# Patient Record
Sex: Female | Born: 1950 | Race: White | Hispanic: No
Health system: Southern US, Community
[De-identification: ages and names within clinical notes are randomized; demographics above are authoritative.]

## PROBLEM LIST (undated history)

## (undated) ENCOUNTER — Emergency Department (HOSPITAL_COMMUNITY): Admission: EM | Payer: Self-pay | Source: Home / Self Care

## (undated) DIAGNOSIS — J309 Allergic rhinitis, unspecified: Secondary | ICD-10-CM

## (undated) DIAGNOSIS — F329 Major depressive disorder, single episode, unspecified: Secondary | ICD-10-CM

## (undated) DIAGNOSIS — R002 Palpitations: Secondary | ICD-10-CM

## (undated) DIAGNOSIS — R079 Chest pain, unspecified: Secondary | ICD-10-CM

## (undated) DIAGNOSIS — E7800 Pure hypercholesterolemia, unspecified: Secondary | ICD-10-CM

## (undated) DIAGNOSIS — Z6830 Body mass index (BMI) 30.0-30.9, adult: Secondary | ICD-10-CM

## (undated) DIAGNOSIS — J45909 Unspecified asthma, uncomplicated: Secondary | ICD-10-CM

## (undated) DIAGNOSIS — F3342 Major depressive disorder, recurrent, in full remission: Secondary | ICD-10-CM

## (undated) DIAGNOSIS — J4530 Mild persistent asthma, uncomplicated: Secondary | ICD-10-CM

## (undated) DIAGNOSIS — E669 Obesity, unspecified: Secondary | ICD-10-CM

## (undated) DIAGNOSIS — N183 Chronic kidney disease, stage 3 unspecified: Secondary | ICD-10-CM

## (undated) DIAGNOSIS — K432 Incisional hernia without obstruction or gangrene: Secondary | ICD-10-CM

## (undated) DIAGNOSIS — E2839 Other primary ovarian failure: Secondary | ICD-10-CM

## (undated) DIAGNOSIS — I10 Essential (primary) hypertension: Secondary | ICD-10-CM

## (undated) DIAGNOSIS — E6609 Other obesity due to excess calories: Secondary | ICD-10-CM

## (undated) DIAGNOSIS — F32A Depression, unspecified: Secondary | ICD-10-CM

## (undated) DIAGNOSIS — K219 Gastro-esophageal reflux disease without esophagitis: Secondary | ICD-10-CM

## (undated) DIAGNOSIS — IMO0001 Reserved for inherently not codable concepts without codable children: Secondary | ICD-10-CM

## (undated) DIAGNOSIS — F324 Major depressive disorder, single episode, in partial remission: Secondary | ICD-10-CM

## (undated) HISTORY — DX: Body mass index (BMI) 30.0-30.9, adult: Z68.30

## (undated) HISTORY — DX: Pure hypercholesterolemia, unspecified: E78.00

## (undated) HISTORY — DX: Major depressive disorder, single episode, in partial remission: F32.4

## (undated) HISTORY — DX: Other obesity due to excess calories: E66.09

## (undated) HISTORY — DX: Palpitations: R00.2

## (undated) HISTORY — DX: Allergic rhinitis, unspecified: J30.9

## (undated) HISTORY — DX: Chronic kidney disease, stage 3 unspecified: N18.30

## (undated) HISTORY — DX: Essential (primary) hypertension: I10

## (undated) HISTORY — DX: Obesity, unspecified: E66.9

## (undated) HISTORY — DX: Chest pain, unspecified: R07.9

## (undated) HISTORY — DX: Mild persistent asthma, uncomplicated: J45.30

## (undated) HISTORY — DX: Major depressive disorder, recurrent, in full remission: F33.42

## (undated) HISTORY — DX: Incisional hernia without obstruction or gangrene: K43.2

## (undated) HISTORY — DX: Gastro-esophageal reflux disease without esophagitis: K21.9

## (undated) HISTORY — PX: WRIST SURGERY: SHX841

## (undated) HISTORY — DX: Other primary ovarian failure: E28.39

---

## 1998-02-16 ENCOUNTER — Inpatient Hospital Stay (HOSPITAL_COMMUNITY): Admission: RE | Admit: 1998-02-16 | Discharge: 1998-02-17 | Payer: Self-pay | Admitting: Neurosurgery

## 1999-10-28 ENCOUNTER — Encounter: Admission: RE | Admit: 1999-10-28 | Discharge: 1999-10-28 | Payer: Self-pay | Admitting: Family Medicine

## 1999-10-28 ENCOUNTER — Encounter: Payer: Self-pay | Admitting: Family Medicine

## 1999-10-29 ENCOUNTER — Inpatient Hospital Stay (HOSPITAL_COMMUNITY): Admission: EM | Admit: 1999-10-29 | Discharge: 1999-11-02 | Payer: Self-pay | Admitting: Internal Medicine

## 1999-10-30 ENCOUNTER — Encounter: Payer: Self-pay | Admitting: Internal Medicine

## 1999-11-01 ENCOUNTER — Encounter: Payer: Self-pay | Admitting: Infectious Diseases

## 1999-11-02 ENCOUNTER — Encounter: Payer: Self-pay | Admitting: Internal Medicine

## 1999-11-12 ENCOUNTER — Ambulatory Visit (HOSPITAL_COMMUNITY): Admission: RE | Admit: 1999-11-12 | Discharge: 1999-11-12 | Payer: Self-pay | Admitting: Family Medicine

## 1999-11-12 ENCOUNTER — Encounter: Payer: Self-pay | Admitting: Family Medicine

## 2002-02-27 ENCOUNTER — Encounter (INDEPENDENT_AMBULATORY_CARE_PROVIDER_SITE_OTHER): Payer: Self-pay | Admitting: Specialist

## 2002-02-27 ENCOUNTER — Ambulatory Visit (HOSPITAL_BASED_OUTPATIENT_CLINIC_OR_DEPARTMENT_OTHER): Admission: RE | Admit: 2002-02-27 | Discharge: 2002-02-27 | Payer: Self-pay | Admitting: *Deleted

## 2006-05-26 ENCOUNTER — Ambulatory Visit (HOSPITAL_COMMUNITY): Admission: RE | Admit: 2006-05-26 | Discharge: 2006-05-26 | Payer: Self-pay | Admitting: General Surgery

## 2006-05-26 ENCOUNTER — Encounter (INDEPENDENT_AMBULATORY_CARE_PROVIDER_SITE_OTHER): Payer: Self-pay | Admitting: Specialist

## 2008-03-21 ENCOUNTER — Encounter: Admission: RE | Admit: 2008-03-21 | Discharge: 2008-03-21 | Payer: Self-pay | Admitting: Gastroenterology

## 2010-05-04 ENCOUNTER — Encounter: Admission: RE | Admit: 2010-05-04 | Discharge: 2010-05-04 | Payer: Self-pay | Admitting: Rheumatology

## 2011-02-25 NOTE — Op Note (Signed)
[name]  Patient:    [name], [name] Visit Number: [id number] MRN: [id number]          Service Type: DSU Location: [facility] Attending Physician:  Kandis Mannan Dictated by:   Donnie Coffin Samuella Cota, M.D. Proc. Date: 02/27/02 Admit Date:  02/27/2002   CC:         Katherine Roan, M.D.   Operative Report  CCS# [id number]  PREOPERATIVE DIAGNOSIS:  Large cyst, right breast with intramural lesion.  POSTOPERATIVE DIAGNOSIS:  Large cyst, right breast with intramural lesion.  OPERATION PERFORMED:  Excision of cystic mass, right breast with intramural lesion.  SURGEON:  Maisie Fus B. Samuella Cota, M.D.  ANESTHESIA:  1% Xylocaine local with anesthesia monitoring, anesthesiologist and CRNA Elliot Dally.  DESCRIPTION OF PROCEDURE:  The patient was taken to the operating room and placed on the table in supine position.  The right breast was prepped and draped in sterile field.  The patient had an obvious palpable cystic mass at the 9 oclock position of the right breast.  A transverse radial incision was outlined with the skin marker.  1% Xylocaine local was then used to infiltrate the skin.  The incision was made through skin and subcutaneous tissues.  The cyst was readily seen and with very gentle dissection, the cyst was dissected completely without rupturing the cyst.  Some breast tissue which was attached posteriorly and laterally was removed with the specimen.  A suture was placed through this breast tissue to mark the posterior lateral margin.  The cystic mass measured 5 x 5 x 8 cm.  The specimen was sent intact to the pathologist for further evaluation of the intramural lesion.  Bleeding was controlled with the cautery.  Subcutaneous tissues were closed with a running subcuticular suture of 4-0 Vicryl.  Benzoin and half inch Steri-Strips were used to reinforced the skin closure.  Dry sterile dressing using 4 x 4s, ABD, and 4 inch HypaFix was applied.  The  patient seemed to tolerate the procedure well and was taken to the PACU in satisfactory condition. Dictated by:   Donnie Coffin Samuella Cota, M.D. Attending Physician:  Kandis Mannan DD:  02/27/02 TD:  02/28/02 Job: [id number]

## 2011-02-25 NOTE — Op Note (Signed)
[name], [name]               ACCOUNT NO.:  [id number]   MEDICAL RECORD NO.:  [id number]          PATIENT TYPE:  AMB   LOCATION:  DAY                          FACILITY:  [facility]   PHYSICIAN:  Angelia Mould. Derrell Lolling, M.D.DATE OF BIRTH:  [birth year]   DATE OF PROCEDURE:  05/26/2006  DATE OF DISCHARGE:                                 OPERATIVE REPORT   PREOPERATIVE DIAGNOSIS:  Chronic cholecystitis with cholelithiasis.   POSTOPERATIVE DIAGNOSIS:  Chronic cholecystitis with cholelithiasis.   OPERATION PERFORMED:  Laparoscopic cholecystectomy with intraoperative  cholangiogram.   SURGEON:  Angelia Mould. Derrell Lolling, M.D.   FIRST ASSISTANT:  Lebron Conners, M.D.   OPERATIVE INDICATIONS:  This is a 60 year old white female who has been  having episodes of biliary colic for about 10 months.  She had ultrasound in  November 2006 which showed gallstones.  She postponed her surgery until this  time.  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  She is brought to the operating room electively.   OPERATIVE FINDINGS:  The patient had loss of soft adhesions in the upper  abdomen.  These were especially dense in the left upper quadrant, but there  were lot of adhesions of the dome of the right lobe of the liver to the  diaphragm as well.  The gallbladder was thin-walled and soft.  The anatomy  of the cystic duct, cystic artery, and common bile duct were conventional.  The cholangiogram was normal, showing normal intrahepatic and extrahepatic  bile duct anatomy, no filling defects, and no obstruction with good flow of  contrast into the duodenum.  The large intestine, small intestine,  peritoneal surfaces, duodenum, and stomach looked normal.   OPERATIVE TECHNIQUE:  Following the induction of general endotracheal  anesthesia, the patient's abdomen was prepped and draped in a  sterile  fashion.  Then 0.5% Marcaine with epinephrine was used as a local  infiltration anesthetic.  A curved transverse incision was made at the lower  rim of the umbilicus, through a previous laparoscopy scar.  The fascia was  incised in the midline and the abdominal cavity entered under direct vision.  A 10-mm Hassan trocar was inserted and secured with a pursestring suture of  #0 Vicryl.  Pneumoperitoneum was created.  Video camera was inserted with  visualization findings as described above.   We placed two 5-mm trocars in the right midabdomen; and with direct  visualization took down a lot of adhesions that were to the right of the  falciform ligament.  I was then able to see clearly to place a 10-mm trocar  in the subxiphoid region.  The patient was positioned.  I took down all of  the adhesions between the undersurface of the diaphragm and the dome of the  liver.  The fundus of the gallbladder were elevated.  There were a lot of  adhesions to the gallbladder.  These were all carefully taken down.  We  probably spent 10  or 15 minutes taking all the adhesions down completely.   The infundibulum of the gallbladder was small; and the cystic duct and the  cystic artery were small.  We dissected out the cystic duct and the cystic  artery.  The cystic artery was secured with metal clips and divided as we  went around the wall of the gallbladder.  This created a large window behind  the cystic duct.  A cholangiogram catheter was inserted into the cystic  duct, and a cholangiogram was obtained using a C-arm.  The cholangiogram was  normal as described above.  The cholangiogram catheter was removed, the  cystic duct secured with multiple metal clips and divided; and the  gallbladder was then dissected from its bed with electrocautery and removed  through the umbilical port.   The operative field was inspected and irrigated.  At the end of the case the  irrigation fluid was completely  clear; and there was no bleeding and no bile  leak whatsoever.  The trocars were removed under direct vision.  There was  no bleeding from the trocar sites.  Pneumoperitoneum was released.  The  fascia at the umbilicus was closed with #0 Vicryl sutures.  Skin incisions  were irrigated with saline; and closed with subcuticular sutures of 4-0  Monocryl and Steri-Strips.  Clean bandages were placed; and the patient  taken to the recovery room in stable condition.  Estimated blood loss about  5-10 mL.  Complications none.  Sponge and instrument counts were correct.      Angelia Mould. Derrell Lolling, M.D.  Electronically Signed     HMI/MEDQ  D:  05/26/2006  T:  05/26/2006  Job:  [id number]   cc:   [name] L. [name], M.D.  Fax: [phone]

## 2014-03-06 ENCOUNTER — Emergency Department (HOSPITAL_BASED_OUTPATIENT_CLINIC_OR_DEPARTMENT_OTHER): Payer: BC Managed Care – PPO

## 2014-03-06 ENCOUNTER — Encounter (HOSPITAL_BASED_OUTPATIENT_CLINIC_OR_DEPARTMENT_OTHER): Payer: Self-pay | Admitting: Emergency Medicine

## 2014-03-06 ENCOUNTER — Emergency Department (HOSPITAL_BASED_OUTPATIENT_CLINIC_OR_DEPARTMENT_OTHER)
Admission: EM | Admit: 2014-03-06 | Discharge: 2014-03-06 | Disposition: A | Discharge status: Home / Self Care | Payer: BC Managed Care – PPO | Attending: Emergency Medicine | Admitting: Emergency Medicine

## 2014-03-06 DIAGNOSIS — F3289 Other specified depressive episodes: Secondary | ICD-10-CM | POA: Insufficient documentation

## 2014-03-06 DIAGNOSIS — Y9301 Activity, walking, marching and hiking: Secondary | ICD-10-CM | POA: Insufficient documentation

## 2014-03-06 DIAGNOSIS — M549 Dorsalgia, unspecified: Secondary | ICD-10-CM

## 2014-03-06 DIAGNOSIS — S0180XA Unspecified open wound of other part of head, initial encounter: Secondary | ICD-10-CM | POA: Insufficient documentation

## 2014-03-06 DIAGNOSIS — W1809XA Striking against other object with subsequent fall, initial encounter: Secondary | ICD-10-CM | POA: Insufficient documentation

## 2014-03-06 DIAGNOSIS — K219 Gastro-esophageal reflux disease without esophagitis: Secondary | ICD-10-CM | POA: Insufficient documentation

## 2014-03-06 DIAGNOSIS — J45909 Unspecified asthma, uncomplicated: Secondary | ICD-10-CM | POA: Insufficient documentation

## 2014-03-06 DIAGNOSIS — S0181XA Laceration without foreign body of other part of head, initial encounter: Secondary | ICD-10-CM

## 2014-03-06 DIAGNOSIS — F329 Major depressive disorder, single episode, unspecified: Secondary | ICD-10-CM | POA: Insufficient documentation

## 2014-03-06 DIAGNOSIS — S60229A Contusion of unspecified hand, initial encounter: Secondary | ICD-10-CM

## 2014-03-06 DIAGNOSIS — Y92009 Unspecified place in unspecified non-institutional (private) residence as the place of occurrence of the external cause: Secondary | ICD-10-CM | POA: Insufficient documentation

## 2014-03-06 DIAGNOSIS — W19XXXA Unspecified fall, initial encounter: Secondary | ICD-10-CM

## 2014-03-06 DIAGNOSIS — W010XXA Fall on same level from slipping, tripping and stumbling without subsequent striking against object, initial encounter: Secondary | ICD-10-CM | POA: Insufficient documentation

## 2014-03-06 DIAGNOSIS — IMO0002 Reserved for concepts with insufficient information to code with codable children: Secondary | ICD-10-CM | POA: Insufficient documentation

## 2014-03-06 HISTORY — DX: Unspecified asthma, uncomplicated: J45.909

## 2014-03-06 HISTORY — DX: Depression, unspecified: F32.A

## 2014-03-06 HISTORY — DX: Major depressive disorder, single episode, unspecified: F32.9

## 2014-03-06 HISTORY — DX: Reserved for inherently not codable concepts without codable children: IMO0001

## 2014-03-06 HISTORY — DX: Gastro-esophageal reflux disease without esophagitis: K21.9

## 2014-03-06 MED ORDER — HYDROCODONE-ACETAMINOPHEN 5-325 MG PO TABS: 1.0000 | ORAL_TABLET | Freq: Four times a day (QID) | ORAL | Status: DC | PRN

## 2014-03-06 NOTE — Discharge Instructions (Signed)
Contusion A contusion is a deep bruise. Contusions are the result of an injury that caused bleeding under the skin. The contusion may turn blue, purple, or yellow. Minor injuries will give you a painless contusion, but more severe contusions may stay painful and swollen for a few weeks.  CAUSES  A contusion is usually caused by a blow, trauma, or direct force to an area of the body. SYMPTOMS   Swelling and redness of the injured area.  Bruising of the injured area.  Tenderness and soreness of the injured area.  Pain. DIAGNOSIS  The diagnosis can be made by taking a history and physical exam. An X-ray, CT scan, or MRI may be needed to determine if there were any associated injuries, such as fractures. TREATMENT  Specific treatment will depend on what area of the body was injured. In general, the best treatment for a contusion is resting, icing, elevating, and applying cold compresses to the injured area. Over-the-counter medicines may also be recommended for pain control. Ask your caregiver what the best treatment is for your contusion. HOME CARE INSTRUCTIONS   Put ice on the injured area.  Put ice in a plastic bag.  Place a towel between your skin and the bag.  Leave the ice on for 15-20 minutes, 03-04 times a day.  Only take over-the-counter or prescription medicines for pain, discomfort, or fever as directed by your caregiver. Your caregiver may recommend avoiding anti-inflammatory medicines (aspirin, ibuprofen, and naproxen) for 48 hours because these medicines may increase bruising.  Rest the injured area.  If possible, elevate the injured area to reduce swelling. SEEK IMMEDIATE MEDICAL CARE IF:   You have increased bruising or swelling.  You have pain that is getting worse.  Your swelling or pain is not relieved with medicines. MAKE SURE YOU:   Understand these instructions.  Will watch your condition.  Will get help right away if you are not doing well or get  worse. Document Released: 07/06/2005 Document Revised: 12/19/2011 Document Reviewed: 08/01/2011 [facility] Patient Information 2014 [place], [place].  Facial Laceration  A facial laceration is a cut on the face. These injuries can be painful and cause bleeding. Lacerations usually heal quickly, but they need special care to reduce scarring. DIAGNOSIS  Your health care provider will take a medical history, ask for details about how the injury occurred, and examine the wound to determine how deep the cut is. TREATMENT  Some facial lacerations may not require closure. Others may not be able to be closed because of an increased risk of infection. The risk of infection and the chance for successful closure will depend on various factors, including the amount of time since the injury occurred. The wound may be cleaned to help prevent infection. If closure is appropriate, pain medicines may be given if needed. Your health care provider will use stitches (sutures), wound glue (adhesive), or skin adhesive strips to repair the laceration. These tools bring the skin edges together to allow for faster healing and a better cosmetic outcome. If needed, you may also be given a tetanus shot. HOME CARE INSTRUCTIONS  Only take over-the-counter or prescription medicines as directed by your health care provider.  Follow your health care provider's instructions for wound care. These instructions will vary depending on the technique used for closing the wound. For Sutures:  Keep the wound clean and dry.   If you were given a bandage (dressing), you should change it at least once a day. Also change the dressing if  it becomes wet or dirty, or as directed by your health care provider.   Wash the wound with soap and water 2 times a day. Rinse the wound off with water to remove all soap. Pat the wound dry with a clean towel.   After cleaning, apply a thin layer of the antibiotic ointment recommended by your health care  provider. This will help prevent infection and keep the dressing from sticking.   You may shower as usual after the first 24 hours. Do not soak the wound in water until the sutures are removed.   Get your sutures removed as directed by your health care provider. With facial lacerations, sutures should usually be taken out after 4 5 days to avoid stitch marks.   Wait a few days after your sutures are removed before applying any makeup. For Skin Adhesive Strips:  Keep the wound clean and dry.   Do not get the skin adhesive strips wet. You may bathe carefully, using caution to keep the wound dry.   If the wound gets wet, pat it dry with a clean towel.   Skin adhesive strips will fall off on their own. You may trim the strips as the wound heals. Do not remove skin adhesive strips that are still stuck to the wound. They will fall off in time.  For Wound Adhesive:  You may briefly wet your wound in the shower or bath. Do not soak or scrub the wound. Do not swim. Avoid periods of heavy sweating until the skin adhesive has fallen off on its own. After showering or bathing, gently pat the wound dry with a clean towel.   Do not apply liquid medicine, cream medicine, ointment medicine, or makeup to your wound while the skin adhesive is in place. This may loosen the film before your wound is healed.   If a dressing is placed over the wound, be careful not to apply tape directly over the skin adhesive. This may cause the adhesive to be pulled off before the wound is healed.   Avoid prolonged exposure to sunlight or tanning lamps while the skin adhesive is in place.  The skin adhesive will usually remain in place for 5 10 days, then naturally fall off the skin. Do not pick at the adhesive film.  After Healing: Once the wound has healed, cover the wound with sunscreen during the day for 1 full year. This can help minimize scarring. Exposure to ultraviolet light in the first year will darken  the scar. It can take 1 2 years for the scar to lose its redness and to heal completely.  SEEK IMMEDIATE MEDICAL CARE IF:  You have redness, pain, or swelling around the wound.   You see ayellowish-white fluid (pus) coming from the wound.   You have chills or a fever.  MAKE SURE YOU:  Understand these instructions.  Will watch your condition.  Will get help right away if you are not doing well or get worse. Document Released: 11/03/2004 Document Revised: 07/17/2013 Document Reviewed: 05/09/2013 [facility] Patient Information 2014 [place], [place].  Laceration Care, Adult A laceration is a cut that goes through all layers of the skin. The cut goes into the tissue beneath the skin. HOME CARE For stitches (sutures) or staples:  Keep the cut clean and dry.  If you have a bandage (dressing), change it at least once a day. Change the bandage if it gets wet or dirty, or as told by your doctor.  Wash the cut with  soap and water 2 times a day. Rinse the cut with water. Pat it dry with a clean towel.  Put a thin layer of medicated cream on the cut as told by your doctor.  You may shower after the first 24 hours. Do not soak the cut in water until the stitches are removed.  Only take medicines as told by your doctor.  Have your stitches or staples removed as told by your doctor. For skin adhesive strips:  Keep the cut clean and dry.  Do not get the strips wet. You may take a bath, but be careful to keep the cut dry.  If the cut gets wet, pat it dry with a clean towel.  The strips will fall off on their own. Do not remove the strips that are still stuck to the cut. For wound glue:  You may shower or take baths. Do not soak or scrub the cut. Do not swim. Avoid heavy sweating until the glue falls off on its own. After a shower or bath, pat the cut dry with a clean towel.  Do not put medicine on your cut until the glue falls off.  If you have a bandage, do not put tape over the  glue.  Avoid lots of sunlight or tanning lamps until the glue falls off. Put sunscreen on the cut for the first year to reduce your scar.  The glue will fall off on its own. Do not pick at the glue. You may need a tetanus shot if:  You cannot remember when you had your last tetanus shot.  You have never had a tetanus shot. If you need a tetanus shot and you choose not to have one, you may get tetanus. Sickness from tetanus can be serious. GET HELP RIGHT AWAY IF:   Your pain does not get better with medicine.  Your arm, hand, leg, or foot loses feeling (numbness) or changes color.  Your cut is bleeding.  Your joint feels weak, or you cannot use your joint.  You have painful lumps on your body.  Your cut is red, puffy (swollen), or painful.  You have a red line on the skin near the cut.  You have yellowish-white fluid (pus) coming from the cut.  You have a fever.  You have a bad smell coming from the cut or bandage.  Your cut breaks open before or after stitches are removed.  You notice something coming out of the cut, such as wood or glass.  You cannot move a finger or toe. MAKE SURE YOU:   Understand these instructions.  Will watch your condition.  Will get help right away if you are not doing well or get worse. Document Released: 03/14/2008 Document Revised: 12/19/2011 Document Reviewed: 03/22/2011 [facility] Patient Information 2014 [place].

## 2014-03-06 NOTE — ED Notes (Addendum)
Patient states she missed a step on her deck and fell.  Landed on her face.  Multiple areas of scrapes and lacerations.  Was seen at [facility], and sent here for further evaluation. Denies loc.

## 2014-03-06 NOTE — ED Notes (Signed)
Patient transported to X-ray 

## 2014-03-06 NOTE — ED Provider Notes (Signed)
CSN: [id number]     Arrival date & time 03/06/14  1157 History   First MD Initiated Contact with Patient 03/06/14 1217     Chief Complaint  Patient presents with  . Facial Laceration    fall     (Consider location/radiation/quality/duration/timing/severity/associated sxs/prior Treatment) HPI Comments: Pt states that she was walking up the stairs of her deck and she tripped and landed hit her face. No loc or dizziness associated with the fall. Pt states that she went to the [facility] and was sent here for further evaluation. Pt c/o lower back pain and right hand pain. Denies blurred vision, numbness or weakness. Tetanus is utd. Pt states that she has multiple cuts to her face. Pt states that she is not having any problems with opening and closing mouth  The history is provided by the patient. No language interpreter was used.    Past Medical History  Diagnosis Date  . Asthma   . Depression   . Reflux    Past Surgical History  Procedure Laterality Date  . Wrist surgery Left    No family history on file. History  Substance Use Topics  . Smoking status: Not on file  . Smokeless tobacco: Not on file  . Alcohol Use: Not on file   OB History   Grav Para Term Preterm Abortions TAB SAB Ect Mult Living                 Review of Systems  Constitutional: Negative.   Cardiovascular: Negative.       Allergies  Tramadol  Home Medications   Prior to Admission medications   Medication Sig Start Date End Date Taking? Authorizing Provider  beclomethasone (QVAR) 40 MCG/ACT inhaler Inhale into the lungs 2 (two) times daily.   Yes Historical Provider, MD  desvenlafaxine (PRISTIQ) 100 MG 24 hr tablet Take 100 mg by mouth daily.   Yes Historical Provider, MD  MULTIPLE VITAMIN PO Take by mouth.   Yes Historical Provider, MD  ranitidine (ZANTAC) 150 MG tablet Take 150 mg by mouth 2 (two) times daily.   Yes Historical Provider, MD   BP 143/85  Pulse 70  Temp(Src) 98.1 F (36.7 C)  (Oral)  Ht 5\' 5"  (1.651 m)  Wt 182 lb (82.555 kg)  BMI 30.29 kg/m2  SpO2 100% Physical Exam  Nursing note and vitals reviewed. Constitutional: She is oriented to person, place, and time. She appears well-developed and well-nourished.  HENT:  Right Ear: External ear normal.  Left Ear: External ear normal.  Opening and closing mouth without any problem.no dental injury noted. Abrasions and laceration to the face  Eyes: Conjunctivae and EOM are normal. Pupils are equal, round, and reactive to light.  Neck: Neck supple.  Cardiovascular: Normal rate and regular rhythm.   Pulmonary/Chest: Effort normal and breath sounds normal.  Abdominal: Soft. Bowel sounds are normal. There is no tenderness.  Musculoskeletal:       Cervical back: Normal.       Thoracic back: Normal.       Lumbar back: She exhibits bony tenderness. She exhibits normal range of motion.  Neurological: She is alert and oriented to person, place, and time.  Skin:  Bruising noted to the right hand. Generalized tenderness. Full rom: laceration to between the eyebrows, irregular one to the chin and one below the nose    ED Course  LACERATION REPAIR Date/Time: 03/06/2014 1:28 PM Performed by: Glendell Docker Authorized by: Glendell Docker Consent: Verbal consent obtained.  Risks and benefits: risks, benefits and alternatives were discussed Patient identity confirmed: verbally with patient Time out: Immediately prior to procedure a "time out" was called to verify the correct patient, procedure, equipment, support staff and site/side marked as required. Body area: head/neck Location details: chin Laceration length: 1.5 cm Contamination: The wound is contaminated. Foreign bodies: unknown Anesthesia: local infiltration Local anesthetic: lidocaine 1% with epinephrine Irrigation solution: saline Irrigation method: syringe Amount of cleaning: extensive Skin closure: 6-0 Prolene Number of sutures: 4 Technique:  simple Approximation: close Approximation difficulty: simple Dressing: 4x4 sterile gauze Patient tolerance: Patient tolerated the procedure well with no immediate complications.  LACERATION REPAIR Date/Time: 03/06/2014 1:29 PM Performed by: Glendell Docker Authorized by: Glendell Docker Consent: Verbal consent obtained. Risks and benefits: risks, benefits and alternatives were discussed Consent given by: patient Patient identity confirmed: verbally with patient Time out: Immediately prior to procedure a "time out" was called to verify the correct patient, procedure, equipment, support staff and site/side marked as required. Body area: head/neck Location details: forehead Laceration length: 2 cm Foreign bodies: no foreign bodies Irrigation method: syringe Skin closure: glue Approximation: close Approximation difficulty: simple Patient tolerance: Patient tolerated the procedure well with no immediate complications.  LACERATION REPAIR Date/Time: 03/06/2014 1:30 PM Performed by: Glendell Docker Authorized by: Glendell Docker Consent: Verbal consent obtained. Risks and benefits: risks, benefits and alternatives were discussed Consent given by: patient Patient identity confirmed: verbally with patient Body area: head/neck (below the nose) Foreign bodies: unknown Irrigation solution: tap water Irrigation method: syringe Amount of cleaning: extensive Skin closure: glue Approximation: close Approximation difficulty: simple Patient tolerance: Patient tolerated the procedure well with no immediate complications.   (including critical care time) Labs Review Labs Reviewed - No data to display  Imaging Review No results found.   EKG Interpretation None      MDM   Final diagnoses:  Facial laceration  Fall  Hand contusion  Back pain    Wounds closed:no bony abnormality noted. Pt is okay to follow up for suture removal. Pt is not on thinners, dont think any  imaging is needed for head and neck at this time    Glendell Docker, NP 03/06/14 1333

## 2014-03-06 NOTE — ED Provider Notes (Signed)
Medical screening examination/treatment/procedure(s) were conducted as a shared visit with non-physician practitioner(s) or resident  and myself.  I personally evaluated the patient during the encounter and agree with the findings and plan unless otherwise indicated.    I have personally reviewed any xrays and/ or EKG's with the provider and I agree with interpretation.   Mechanical fall landing on face with lacerations.  Denies HA, vomiting or blood thinners. Pt feels well other than lacerations.  On exam multiple small lacerations repaired by PA, well approximated, CNs intact, no neck pain, full rom head and neck, well appearing otherwise.  Fup discussed.   Facial lacerations, acute head injury  Mariea Clonts, MD 03/06/14 1546

## 2014-05-13 DIAGNOSIS — Z0000 Encounter for general adult medical examination without abnormal findings: Secondary | ICD-10-CM | POA: Diagnosis not present

## 2014-05-13 DIAGNOSIS — N951 Menopausal and female climacteric states: Secondary | ICD-10-CM | POA: Diagnosis not present

## 2014-05-13 DIAGNOSIS — Z136 Encounter for screening for cardiovascular disorders: Secondary | ICD-10-CM | POA: Diagnosis not present

## 2014-05-13 DIAGNOSIS — Z131 Encounter for screening for diabetes mellitus: Secondary | ICD-10-CM | POA: Diagnosis not present

## 2014-05-26 DIAGNOSIS — M899 Disorder of bone, unspecified: Secondary | ICD-10-CM | POA: Diagnosis not present

## 2014-10-24 DIAGNOSIS — J019 Acute sinusitis, unspecified: Secondary | ICD-10-CM | POA: Diagnosis not present

## 2014-10-29 DIAGNOSIS — H26493 Other secondary cataract, bilateral: Secondary | ICD-10-CM | POA: Diagnosis not present

## 2014-10-29 DIAGNOSIS — H538 Other visual disturbances: Secondary | ICD-10-CM | POA: Diagnosis not present

## 2014-10-29 DIAGNOSIS — Z961 Presence of intraocular lens: Secondary | ICD-10-CM | POA: Diagnosis not present

## 2014-11-07 DIAGNOSIS — H18413 Arcus senilis, bilateral: Secondary | ICD-10-CM | POA: Diagnosis not present

## 2014-11-07 DIAGNOSIS — Z961 Presence of intraocular lens: Secondary | ICD-10-CM | POA: Diagnosis not present

## 2014-11-07 DIAGNOSIS — H02839 Dermatochalasis of unspecified eye, unspecified eyelid: Secondary | ICD-10-CM | POA: Diagnosis not present

## 2014-11-07 DIAGNOSIS — H35372 Puckering of macula, left eye: Secondary | ICD-10-CM | POA: Diagnosis not present

## 2014-11-07 DIAGNOSIS — H26492 Other secondary cataract, left eye: Secondary | ICD-10-CM | POA: Diagnosis not present

## 2014-11-19 DIAGNOSIS — F3342 Major depressive disorder, recurrent, in full remission: Secondary | ICD-10-CM | POA: Diagnosis not present

## 2014-11-19 DIAGNOSIS — K432 Incisional hernia without obstruction or gangrene: Secondary | ICD-10-CM | POA: Diagnosis not present

## 2014-12-30 DIAGNOSIS — H40013 Open angle with borderline findings, low risk, bilateral: Secondary | ICD-10-CM | POA: Diagnosis not present

## 2015-05-19 DIAGNOSIS — F3342 Major depressive disorder, recurrent, in full remission: Secondary | ICD-10-CM | POA: Diagnosis not present

## 2015-05-19 DIAGNOSIS — K432 Incisional hernia without obstruction or gangrene: Secondary | ICD-10-CM | POA: Diagnosis not present

## 2015-05-19 DIAGNOSIS — R6889 Other general symptoms and signs: Secondary | ICD-10-CM | POA: Diagnosis not present

## 2015-06-18 DIAGNOSIS — Z131 Encounter for screening for diabetes mellitus: Secondary | ICD-10-CM | POA: Diagnosis not present

## 2015-06-18 DIAGNOSIS — Z0000 Encounter for general adult medical examination without abnormal findings: Secondary | ICD-10-CM | POA: Diagnosis not present

## 2015-06-18 DIAGNOSIS — Z23 Encounter for immunization: Secondary | ICD-10-CM | POA: Diagnosis not present

## 2015-06-24 DIAGNOSIS — Z0000 Encounter for general adult medical examination without abnormal findings: Secondary | ICD-10-CM | POA: Diagnosis not present

## 2015-06-24 DIAGNOSIS — Z23 Encounter for immunization: Secondary | ICD-10-CM | POA: Diagnosis not present

## 2015-06-24 DIAGNOSIS — Z131 Encounter for screening for diabetes mellitus: Secondary | ICD-10-CM | POA: Diagnosis not present

## 2015-07-02 DIAGNOSIS — H40013 Open angle with borderline findings, low risk, bilateral: Secondary | ICD-10-CM | POA: Diagnosis not present

## 2015-07-02 DIAGNOSIS — H3531 Nonexudative age-related macular degeneration: Secondary | ICD-10-CM | POA: Diagnosis not present

## 2015-07-21 DIAGNOSIS — Z961 Presence of intraocular lens: Secondary | ICD-10-CM | POA: Diagnosis not present

## 2015-07-21 DIAGNOSIS — H18413 Arcus senilis, bilateral: Secondary | ICD-10-CM | POA: Diagnosis not present

## 2015-07-21 DIAGNOSIS — H02839 Dermatochalasis of unspecified eye, unspecified eyelid: Secondary | ICD-10-CM | POA: Diagnosis not present

## 2015-07-21 DIAGNOSIS — H26491 Other secondary cataract, right eye: Secondary | ICD-10-CM | POA: Diagnosis not present

## 2015-12-28 DIAGNOSIS — H40013 Open angle with borderline findings, low risk, bilateral: Secondary | ICD-10-CM | POA: Diagnosis not present

## 2015-12-28 DIAGNOSIS — H16223 Keratoconjunctivitis sicca, not specified as Sjogren's, bilateral: Secondary | ICD-10-CM | POA: Diagnosis not present

## 2015-12-28 DIAGNOSIS — Z961 Presence of intraocular lens: Secondary | ICD-10-CM | POA: Diagnosis not present

## 2016-01-28 IMAGING — CR DG LUMBAR SPINE COMPLETE 4+V
5 series · 5 of 5 positions shown · non-contrast
Comparison: None.

CLINICAL DATA: Low back pain following injury

EXAM:
LUMBAR SPINE - COMPLETE 4+ VIEW

[t l-spine a.p.]
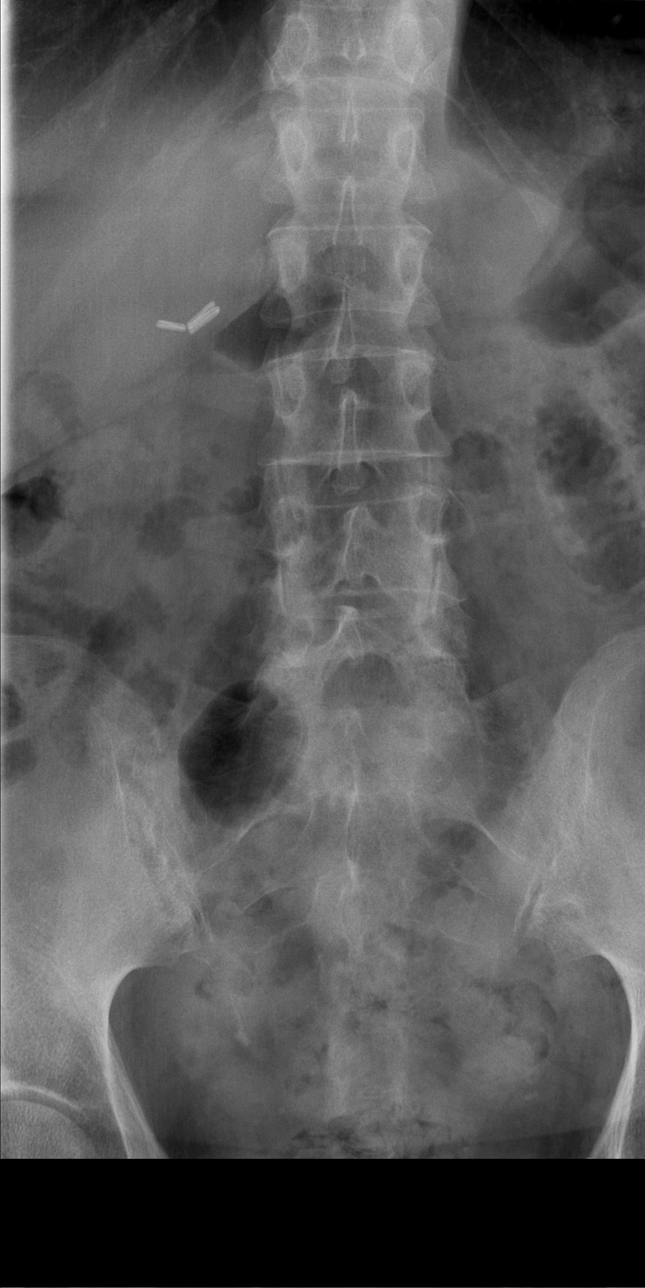

[t l-spine oblique exposure (1 of 2)]
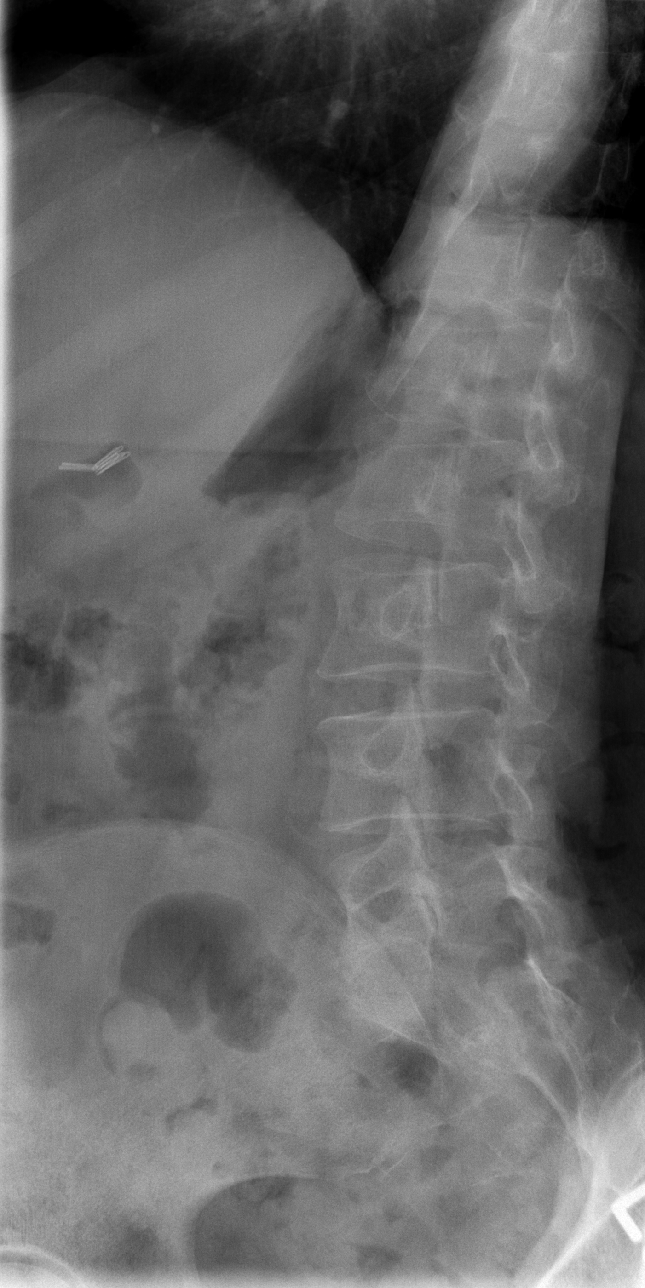

[t l-spine oblique exposure (2 of 2)]
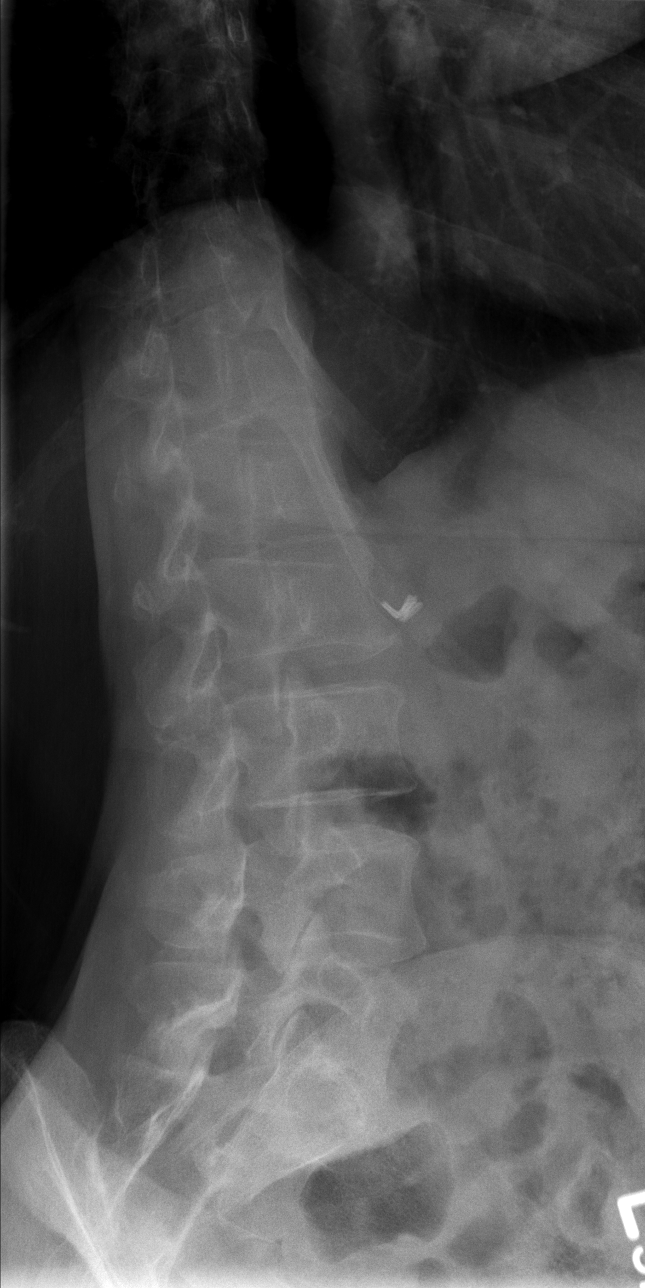

[t l-spine lat]
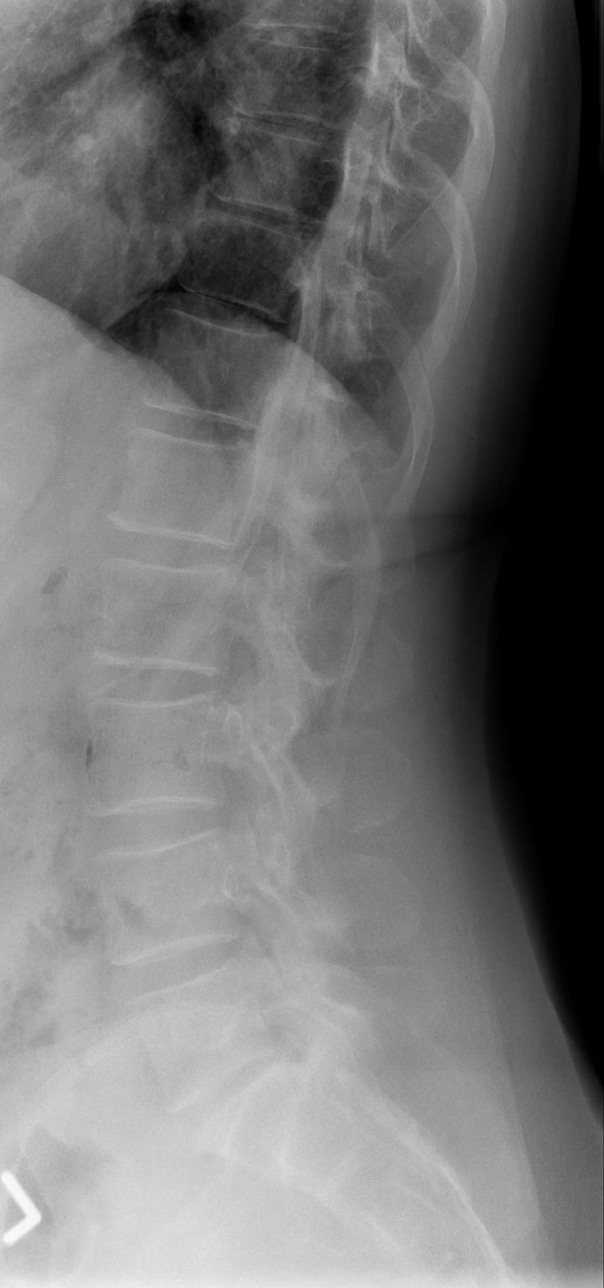

[t l-spine l5-s1 spot]
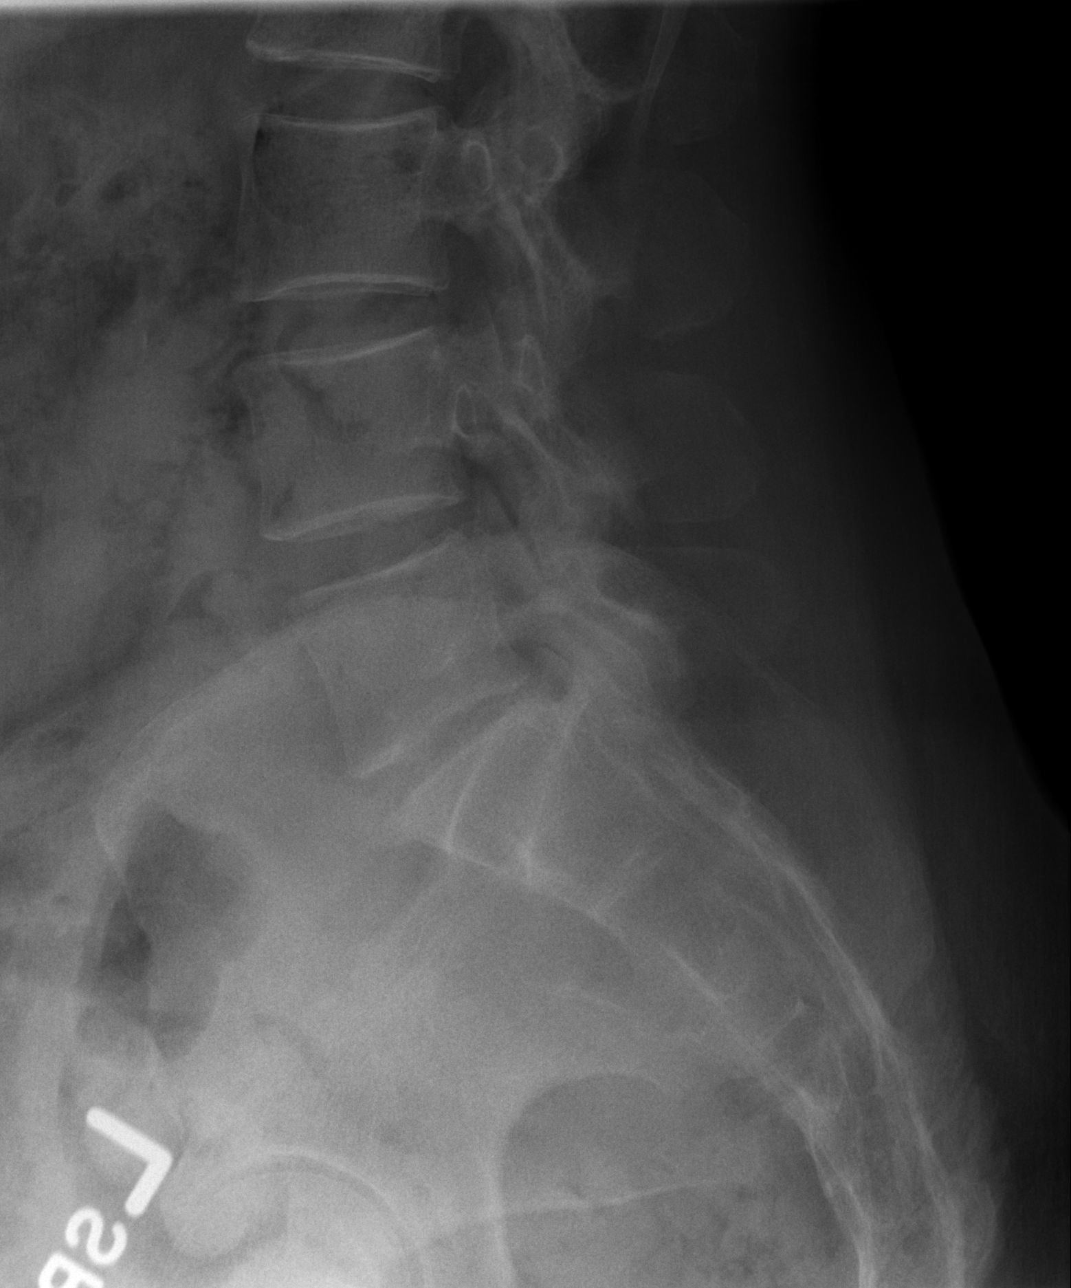

[5 of 5 positions shown; findings below may reference images not displayed]

FINDINGS: Five lumbar type vertebral bodies are well visualized. No pars
defects are seen. Mild disc space narrowing is noted at L5-S1. No
acute bony abnormality is seen.
IMPRESSION: Mild degenerative change without acute abnormality.

## 2016-07-22 DIAGNOSIS — J040 Acute laryngitis: Secondary | ICD-10-CM | POA: Diagnosis not present

## 2016-07-22 DIAGNOSIS — J029 Acute pharyngitis, unspecified: Secondary | ICD-10-CM | POA: Diagnosis not present

## 2016-07-30 DIAGNOSIS — H6692 Otitis media, unspecified, left ear: Secondary | ICD-10-CM | POA: Diagnosis not present

## 2016-07-30 DIAGNOSIS — R05 Cough: Secondary | ICD-10-CM | POA: Diagnosis not present

## 2016-08-14 DIAGNOSIS — J309 Allergic rhinitis, unspecified: Secondary | ICD-10-CM | POA: Diagnosis not present

## 2016-09-13 DIAGNOSIS — Z1211 Encounter for screening for malignant neoplasm of colon: Secondary | ICD-10-CM | POA: Diagnosis not present

## 2016-09-13 DIAGNOSIS — E7800 Pure hypercholesterolemia, unspecified: Secondary | ICD-10-CM | POA: Diagnosis not present

## 2016-09-13 DIAGNOSIS — I10 Essential (primary) hypertension: Secondary | ICD-10-CM | POA: Diagnosis not present

## 2016-09-13 DIAGNOSIS — R030 Elevated blood-pressure reading, without diagnosis of hypertension: Secondary | ICD-10-CM | POA: Diagnosis not present

## 2016-09-13 DIAGNOSIS — K219 Gastro-esophageal reflux disease without esophagitis: Secondary | ICD-10-CM | POA: Diagnosis not present

## 2016-09-13 DIAGNOSIS — Z0000 Encounter for general adult medical examination without abnormal findings: Secondary | ICD-10-CM | POA: Diagnosis not present

## 2016-09-13 DIAGNOSIS — J4530 Mild persistent asthma, uncomplicated: Secondary | ICD-10-CM | POA: Diagnosis not present

## 2016-09-13 DIAGNOSIS — Z131 Encounter for screening for diabetes mellitus: Secondary | ICD-10-CM | POA: Diagnosis not present

## 2016-09-13 DIAGNOSIS — M25562 Pain in left knee: Secondary | ICD-10-CM | POA: Diagnosis not present

## 2016-09-13 DIAGNOSIS — E785 Hyperlipidemia, unspecified: Secondary | ICD-10-CM | POA: Diagnosis not present

## 2016-09-13 DIAGNOSIS — M8580 Other specified disorders of bone density and structure, unspecified site: Secondary | ICD-10-CM | POA: Diagnosis not present

## 2016-09-13 DIAGNOSIS — Z23 Encounter for immunization: Secondary | ICD-10-CM | POA: Diagnosis not present

## 2016-10-25 DIAGNOSIS — Z1389 Encounter for screening for other disorder: Secondary | ICD-10-CM | POA: Diagnosis not present

## 2016-10-25 DIAGNOSIS — I10 Essential (primary) hypertension: Secondary | ICD-10-CM | POA: Diagnosis not present

## 2016-11-09 DIAGNOSIS — M810 Age-related osteoporosis without current pathological fracture: Secondary | ICD-10-CM | POA: Diagnosis not present

## 2016-11-09 DIAGNOSIS — M8588 Other specified disorders of bone density and structure, other site: Secondary | ICD-10-CM | POA: Diagnosis not present

## 2016-12-26 ENCOUNTER — Emergency Department (HOSPITAL_COMMUNITY): Payer: No Typology Code available for payment source

## 2016-12-26 ENCOUNTER — Emergency Department (HOSPITAL_COMMUNITY)
Admission: EM | Admit: 2016-12-26 | Discharge: 2016-12-26 | Disposition: A | Discharge status: Home / Self Care | Payer: No Typology Code available for payment source | Attending: Emergency Medicine | Admitting: Emergency Medicine

## 2016-12-26 DIAGNOSIS — S098XXA Other specified injuries of head, initial encounter: Secondary | ICD-10-CM | POA: Diagnosis not present

## 2016-12-26 DIAGNOSIS — Y9301 Activity, walking, marching and hiking: Secondary | ICD-10-CM | POA: Diagnosis not present

## 2016-12-26 DIAGNOSIS — M542 Cervicalgia: Secondary | ICD-10-CM | POA: Insufficient documentation

## 2016-12-26 DIAGNOSIS — Z79899 Other long term (current) drug therapy: Secondary | ICD-10-CM | POA: Diagnosis not present

## 2016-12-26 DIAGNOSIS — Y999 Unspecified external cause status: Secondary | ICD-10-CM | POA: Insufficient documentation

## 2016-12-26 DIAGNOSIS — Y92488 Other paved roadways as the place of occurrence of the external cause: Secondary | ICD-10-CM | POA: Diagnosis not present

## 2016-12-26 DIAGNOSIS — S99912A Unspecified injury of left ankle, initial encounter: Secondary | ICD-10-CM | POA: Diagnosis not present

## 2016-12-26 DIAGNOSIS — M79672 Pain in left foot: Secondary | ICD-10-CM | POA: Diagnosis not present

## 2016-12-26 DIAGNOSIS — S0101XA Laceration without foreign body of scalp, initial encounter: Secondary | ICD-10-CM | POA: Diagnosis not present

## 2016-12-26 DIAGNOSIS — M25572 Pain in left ankle and joints of left foot: Secondary | ICD-10-CM | POA: Insufficient documentation

## 2016-12-26 DIAGNOSIS — S0990XA Unspecified injury of head, initial encounter: Secondary | ICD-10-CM | POA: Diagnosis present

## 2016-12-26 DIAGNOSIS — S3993XA Unspecified injury of pelvis, initial encounter: Secondary | ICD-10-CM | POA: Diagnosis not present

## 2016-12-26 DIAGNOSIS — G4489 Other headache syndrome: Secondary | ICD-10-CM | POA: Diagnosis not present

## 2016-12-26 DIAGNOSIS — S299XXA Unspecified injury of thorax, initial encounter: Secondary | ICD-10-CM | POA: Diagnosis not present

## 2016-12-26 DIAGNOSIS — S199XXA Unspecified injury of neck, initial encounter: Secondary | ICD-10-CM | POA: Diagnosis not present

## 2016-12-26 DIAGNOSIS — R51 Headache: Secondary | ICD-10-CM | POA: Diagnosis not present

## 2016-12-26 DIAGNOSIS — S99922A Unspecified injury of left foot, initial encounter: Secondary | ICD-10-CM | POA: Diagnosis not present

## 2016-12-26 DIAGNOSIS — R102 Pelvic and perineal pain: Secondary | ICD-10-CM | POA: Diagnosis not present

## 2016-12-26 LAB — COMPREHENSIVE METABOLIC PANEL
ALBUMIN: 3.6 g/dL (ref 3.5–5.0)
ALK PHOS: 58 U/L (ref 38–126)
ALT: 15 U/L (ref 14–54)
AST: 20 U/L (ref 15–41)
Anion gap: 13 (ref 5–15)
BUN: 10 mg/dL (ref 6–20)
CALCIUM: 9 mg/dL (ref 8.9–10.3)
CHLORIDE: 101 mmol/L (ref 101–111)
CO2: 21 mmol/L — AB (ref 22–32)
CREATININE: 1.01 mg/dL — AB (ref 0.44–1.00)
GFR calc Af Amer: >60 mL/min (ref >60)
GFR calc non Af Amer: 56 mL/min — ABNORMAL LOW (ref >60)
Glucose, Bld: 117 mg/dL — ABNORMAL HIGH (ref 65–99)
Potassium: 3.6 mmol/L (ref 3.5–5.1)
SODIUM: 135 mmol/L (ref 135–145)
Total Bilirubin: 0.6 mg/dL (ref 0.3–1.2)
Total Protein: 6 g/dL — ABNORMAL LOW (ref 6.5–8.1)

## 2016-12-26 LAB — CBC
HEMATOCRIT: 39.4 % (ref 36.0–46.0)
Hemoglobin: 13.2 g/dL (ref 12.0–15.0)
MCH: 29.1 pg (ref 26.0–34.0)
MCHC: 33.5 g/dL (ref 30.0–36.0)
MCV: 87 fL (ref 78.0–100.0)
PLATELETS: 247 10*3/uL (ref 150–400)
RBC: 4.53 MIL/uL (ref 3.87–5.11)
RDW: 13.1 % (ref 11.5–15.5)
WBC: 7.3 10*3/uL (ref 4.0–10.5)

## 2016-12-26 NOTE — ED Notes (Signed)
Lac to back of head stapled with 4 staples- per Dr. Raymon Mutton and Dr. Vanita Panda

## 2016-12-26 NOTE — ED Triage Notes (Signed)
See trauma note
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
## 2016-12-26 NOTE — ED Notes (Signed)
Report to Kara Dies, RN
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
## 2016-12-26 NOTE — Discharge Instructions (Signed)
The staples in your head (4) need to be removed in 5-7 days. This can occur at any healthcare facility.   Your kidney function was slightly worse than prior today (Cr 1.01, GFR 56). Please follow-up with your primary care provider for repeat testing within the next couple of months.

## 2017-01-02 DIAGNOSIS — S0101XA Laceration without foreign body of scalp, initial encounter: Secondary | ICD-10-CM | POA: Diagnosis not present

## 2017-01-02 DIAGNOSIS — S7001XA Contusion of right hip, initial encounter: Secondary | ICD-10-CM | POA: Diagnosis not present

## 2017-01-02 DIAGNOSIS — I10 Essential (primary) hypertension: Secondary | ICD-10-CM | POA: Diagnosis not present

## 2017-01-02 DIAGNOSIS — Z4802 Encounter for removal of sutures: Secondary | ICD-10-CM | POA: Diagnosis not present

## 2017-03-17 DIAGNOSIS — E7800 Pure hypercholesterolemia, unspecified: Secondary | ICD-10-CM | POA: Diagnosis not present

## 2017-06-02 DIAGNOSIS — H1045 Other chronic allergic conjunctivitis: Secondary | ICD-10-CM | POA: Diagnosis not present

## 2017-06-02 DIAGNOSIS — J309 Allergic rhinitis, unspecified: Secondary | ICD-10-CM | POA: Diagnosis not present

## 2017-06-02 DIAGNOSIS — J3089 Other allergic rhinitis: Secondary | ICD-10-CM | POA: Diagnosis not present

## 2017-06-02 DIAGNOSIS — J4520 Mild intermittent asthma, uncomplicated: Secondary | ICD-10-CM | POA: Diagnosis not present

## 2017-06-02 DIAGNOSIS — K219 Gastro-esophageal reflux disease without esophagitis: Secondary | ICD-10-CM | POA: Diagnosis not present

## 2017-06-09 DIAGNOSIS — J301 Allergic rhinitis due to pollen: Secondary | ICD-10-CM | POA: Diagnosis not present

## 2017-06-09 DIAGNOSIS — J3081 Allergic rhinitis due to animal (cat) (dog) hair and dander: Secondary | ICD-10-CM | POA: Diagnosis not present

## 2017-06-09 DIAGNOSIS — J3089 Other allergic rhinitis: Secondary | ICD-10-CM | POA: Diagnosis not present

## 2017-08-09 DIAGNOSIS — J301 Allergic rhinitis due to pollen: Secondary | ICD-10-CM | POA: Diagnosis not present

## 2017-08-09 DIAGNOSIS — J3081 Allergic rhinitis due to animal (cat) (dog) hair and dander: Secondary | ICD-10-CM | POA: Diagnosis not present

## 2017-08-09 DIAGNOSIS — J3089 Other allergic rhinitis: Secondary | ICD-10-CM | POA: Diagnosis not present

## 2017-08-11 DIAGNOSIS — J301 Allergic rhinitis due to pollen: Secondary | ICD-10-CM | POA: Diagnosis not present

## 2017-08-11 DIAGNOSIS — J3089 Other allergic rhinitis: Secondary | ICD-10-CM | POA: Diagnosis not present

## 2017-08-11 DIAGNOSIS — J3081 Allergic rhinitis due to animal (cat) (dog) hair and dander: Secondary | ICD-10-CM | POA: Diagnosis not present

## 2017-08-14 DIAGNOSIS — J3081 Allergic rhinitis due to animal (cat) (dog) hair and dander: Secondary | ICD-10-CM | POA: Diagnosis not present

## 2017-08-14 DIAGNOSIS — J3089 Other allergic rhinitis: Secondary | ICD-10-CM | POA: Diagnosis not present

## 2017-08-14 DIAGNOSIS — J301 Allergic rhinitis due to pollen: Secondary | ICD-10-CM | POA: Diagnosis not present

## 2017-08-17 DIAGNOSIS — J301 Allergic rhinitis due to pollen: Secondary | ICD-10-CM | POA: Diagnosis not present

## 2017-08-17 DIAGNOSIS — J3089 Other allergic rhinitis: Secondary | ICD-10-CM | POA: Diagnosis not present

## 2017-08-17 DIAGNOSIS — J3081 Allergic rhinitis due to animal (cat) (dog) hair and dander: Secondary | ICD-10-CM | POA: Diagnosis not present

## 2017-08-21 DIAGNOSIS — J3081 Allergic rhinitis due to animal (cat) (dog) hair and dander: Secondary | ICD-10-CM | POA: Diagnosis not present

## 2017-08-21 DIAGNOSIS — J3089 Other allergic rhinitis: Secondary | ICD-10-CM | POA: Diagnosis not present

## 2017-08-21 DIAGNOSIS — J301 Allergic rhinitis due to pollen: Secondary | ICD-10-CM | POA: Diagnosis not present

## 2017-08-23 DIAGNOSIS — J3089 Other allergic rhinitis: Secondary | ICD-10-CM | POA: Diagnosis not present

## 2017-08-23 DIAGNOSIS — J3081 Allergic rhinitis due to animal (cat) (dog) hair and dander: Secondary | ICD-10-CM | POA: Diagnosis not present

## 2017-08-23 DIAGNOSIS — J301 Allergic rhinitis due to pollen: Secondary | ICD-10-CM | POA: Diagnosis not present

## 2017-08-28 DIAGNOSIS — J3081 Allergic rhinitis due to animal (cat) (dog) hair and dander: Secondary | ICD-10-CM | POA: Diagnosis not present

## 2017-08-28 DIAGNOSIS — J301 Allergic rhinitis due to pollen: Secondary | ICD-10-CM | POA: Diagnosis not present

## 2017-08-28 DIAGNOSIS — J3089 Other allergic rhinitis: Secondary | ICD-10-CM | POA: Diagnosis not present

## 2017-08-30 DIAGNOSIS — J3081 Allergic rhinitis due to animal (cat) (dog) hair and dander: Secondary | ICD-10-CM | POA: Diagnosis not present

## 2017-08-30 DIAGNOSIS — J3089 Other allergic rhinitis: Secondary | ICD-10-CM | POA: Diagnosis not present

## 2017-08-30 DIAGNOSIS — J301 Allergic rhinitis due to pollen: Secondary | ICD-10-CM | POA: Diagnosis not present

## 2017-09-04 DIAGNOSIS — J301 Allergic rhinitis due to pollen: Secondary | ICD-10-CM | POA: Diagnosis not present

## 2017-09-04 DIAGNOSIS — J3089 Other allergic rhinitis: Secondary | ICD-10-CM | POA: Diagnosis not present

## 2017-09-04 DIAGNOSIS — J3081 Allergic rhinitis due to animal (cat) (dog) hair and dander: Secondary | ICD-10-CM | POA: Diagnosis not present

## 2017-09-06 DIAGNOSIS — J3081 Allergic rhinitis due to animal (cat) (dog) hair and dander: Secondary | ICD-10-CM | POA: Diagnosis not present

## 2017-09-06 DIAGNOSIS — J3089 Other allergic rhinitis: Secondary | ICD-10-CM | POA: Diagnosis not present

## 2017-09-06 DIAGNOSIS — J301 Allergic rhinitis due to pollen: Secondary | ICD-10-CM | POA: Diagnosis not present

## 2017-09-12 DIAGNOSIS — J3081 Allergic rhinitis due to animal (cat) (dog) hair and dander: Secondary | ICD-10-CM | POA: Diagnosis not present

## 2017-09-12 DIAGNOSIS — J3089 Other allergic rhinitis: Secondary | ICD-10-CM | POA: Diagnosis not present

## 2017-09-12 DIAGNOSIS — J301 Allergic rhinitis due to pollen: Secondary | ICD-10-CM | POA: Diagnosis not present

## 2017-09-14 DIAGNOSIS — J3089 Other allergic rhinitis: Secondary | ICD-10-CM | POA: Diagnosis not present

## 2017-09-14 DIAGNOSIS — J301 Allergic rhinitis due to pollen: Secondary | ICD-10-CM | POA: Diagnosis not present

## 2017-09-14 DIAGNOSIS — J3081 Allergic rhinitis due to animal (cat) (dog) hair and dander: Secondary | ICD-10-CM | POA: Diagnosis not present

## 2017-09-21 DIAGNOSIS — J3081 Allergic rhinitis due to animal (cat) (dog) hair and dander: Secondary | ICD-10-CM | POA: Diagnosis not present

## 2017-09-21 DIAGNOSIS — J301 Allergic rhinitis due to pollen: Secondary | ICD-10-CM | POA: Diagnosis not present

## 2017-09-21 DIAGNOSIS — J3089 Other allergic rhinitis: Secondary | ICD-10-CM | POA: Diagnosis not present

## 2017-09-27 DIAGNOSIS — J3089 Other allergic rhinitis: Secondary | ICD-10-CM | POA: Diagnosis not present

## 2017-09-27 DIAGNOSIS — J301 Allergic rhinitis due to pollen: Secondary | ICD-10-CM | POA: Diagnosis not present

## 2017-09-27 DIAGNOSIS — J3081 Allergic rhinitis due to animal (cat) (dog) hair and dander: Secondary | ICD-10-CM | POA: Diagnosis not present

## 2017-10-09 DIAGNOSIS — J3089 Other allergic rhinitis: Secondary | ICD-10-CM | POA: Diagnosis not present

## 2017-10-09 DIAGNOSIS — J301 Allergic rhinitis due to pollen: Secondary | ICD-10-CM | POA: Diagnosis not present

## 2017-10-12 DIAGNOSIS — J3089 Other allergic rhinitis: Secondary | ICD-10-CM | POA: Diagnosis not present

## 2017-10-12 DIAGNOSIS — J301 Allergic rhinitis due to pollen: Secondary | ICD-10-CM | POA: Diagnosis not present

## 2017-10-12 DIAGNOSIS — J3081 Allergic rhinitis due to animal (cat) (dog) hair and dander: Secondary | ICD-10-CM | POA: Diagnosis not present

## 2017-10-16 DIAGNOSIS — J3089 Other allergic rhinitis: Secondary | ICD-10-CM | POA: Diagnosis not present

## 2017-10-16 DIAGNOSIS — J3081 Allergic rhinitis due to animal (cat) (dog) hair and dander: Secondary | ICD-10-CM | POA: Diagnosis not present

## 2017-10-16 DIAGNOSIS — J301 Allergic rhinitis due to pollen: Secondary | ICD-10-CM | POA: Diagnosis not present

## 2017-10-19 DIAGNOSIS — J3081 Allergic rhinitis due to animal (cat) (dog) hair and dander: Secondary | ICD-10-CM | POA: Diagnosis not present

## 2017-10-19 DIAGNOSIS — J301 Allergic rhinitis due to pollen: Secondary | ICD-10-CM | POA: Diagnosis not present

## 2017-10-19 DIAGNOSIS — J3089 Other allergic rhinitis: Secondary | ICD-10-CM | POA: Diagnosis not present

## 2017-10-24 DIAGNOSIS — J3089 Other allergic rhinitis: Secondary | ICD-10-CM | POA: Diagnosis not present

## 2017-10-24 DIAGNOSIS — J301 Allergic rhinitis due to pollen: Secondary | ICD-10-CM | POA: Diagnosis not present

## 2017-10-24 DIAGNOSIS — J3081 Allergic rhinitis due to animal (cat) (dog) hair and dander: Secondary | ICD-10-CM | POA: Diagnosis not present

## 2017-10-27 DIAGNOSIS — J301 Allergic rhinitis due to pollen: Secondary | ICD-10-CM | POA: Diagnosis not present

## 2017-10-27 DIAGNOSIS — J3089 Other allergic rhinitis: Secondary | ICD-10-CM | POA: Diagnosis not present

## 2017-10-31 DIAGNOSIS — J3081 Allergic rhinitis due to animal (cat) (dog) hair and dander: Secondary | ICD-10-CM | POA: Diagnosis not present

## 2017-10-31 DIAGNOSIS — J301 Allergic rhinitis due to pollen: Secondary | ICD-10-CM | POA: Diagnosis not present

## 2017-10-31 DIAGNOSIS — J3089 Other allergic rhinitis: Secondary | ICD-10-CM | POA: Diagnosis not present

## 2017-11-03 DIAGNOSIS — J301 Allergic rhinitis due to pollen: Secondary | ICD-10-CM | POA: Diagnosis not present

## 2017-11-03 DIAGNOSIS — J3089 Other allergic rhinitis: Secondary | ICD-10-CM | POA: Diagnosis not present

## 2017-11-03 DIAGNOSIS — J3081 Allergic rhinitis due to animal (cat) (dog) hair and dander: Secondary | ICD-10-CM | POA: Diagnosis not present

## 2017-11-03 DIAGNOSIS — E7800 Pure hypercholesterolemia, unspecified: Secondary | ICD-10-CM | POA: Diagnosis not present

## 2017-11-03 DIAGNOSIS — K219 Gastro-esophageal reflux disease without esophagitis: Secondary | ICD-10-CM | POA: Diagnosis not present

## 2017-11-03 DIAGNOSIS — F3342 Major depressive disorder, recurrent, in full remission: Secondary | ICD-10-CM | POA: Diagnosis not present

## 2017-11-03 DIAGNOSIS — I10 Essential (primary) hypertension: Secondary | ICD-10-CM | POA: Diagnosis not present

## 2017-11-03 DIAGNOSIS — J4530 Mild persistent asthma, uncomplicated: Secondary | ICD-10-CM | POA: Diagnosis not present

## 2017-11-07 DIAGNOSIS — J3089 Other allergic rhinitis: Secondary | ICD-10-CM | POA: Diagnosis not present

## 2017-11-07 DIAGNOSIS — J301 Allergic rhinitis due to pollen: Secondary | ICD-10-CM | POA: Diagnosis not present

## 2017-11-07 DIAGNOSIS — J3081 Allergic rhinitis due to animal (cat) (dog) hair and dander: Secondary | ICD-10-CM | POA: Diagnosis not present

## 2017-11-10 DIAGNOSIS — J301 Allergic rhinitis due to pollen: Secondary | ICD-10-CM | POA: Diagnosis not present

## 2017-11-10 DIAGNOSIS — J3089 Other allergic rhinitis: Secondary | ICD-10-CM | POA: Diagnosis not present

## 2017-11-10 DIAGNOSIS — J3081 Allergic rhinitis due to animal (cat) (dog) hair and dander: Secondary | ICD-10-CM | POA: Diagnosis not present

## 2017-11-17 DIAGNOSIS — J301 Allergic rhinitis due to pollen: Secondary | ICD-10-CM | POA: Diagnosis not present

## 2017-11-17 DIAGNOSIS — J3081 Allergic rhinitis due to animal (cat) (dog) hair and dander: Secondary | ICD-10-CM | POA: Diagnosis not present

## 2017-11-17 DIAGNOSIS — J3089 Other allergic rhinitis: Secondary | ICD-10-CM | POA: Diagnosis not present

## 2017-11-28 DIAGNOSIS — J3081 Allergic rhinitis due to animal (cat) (dog) hair and dander: Secondary | ICD-10-CM | POA: Diagnosis not present

## 2017-11-28 DIAGNOSIS — J301 Allergic rhinitis due to pollen: Secondary | ICD-10-CM | POA: Diagnosis not present

## 2017-11-28 DIAGNOSIS — J3089 Other allergic rhinitis: Secondary | ICD-10-CM | POA: Diagnosis not present

## 2017-12-06 DIAGNOSIS — J301 Allergic rhinitis due to pollen: Secondary | ICD-10-CM | POA: Diagnosis not present

## 2017-12-06 DIAGNOSIS — J3089 Other allergic rhinitis: Secondary | ICD-10-CM | POA: Diagnosis not present

## 2017-12-06 DIAGNOSIS — J3081 Allergic rhinitis due to animal (cat) (dog) hair and dander: Secondary | ICD-10-CM | POA: Diagnosis not present

## 2017-12-06 DIAGNOSIS — J4520 Mild intermittent asthma, uncomplicated: Secondary | ICD-10-CM | POA: Diagnosis not present

## 2017-12-15 DIAGNOSIS — M8589 Other specified disorders of bone density and structure, multiple sites: Secondary | ICD-10-CM | POA: Diagnosis not present

## 2017-12-15 DIAGNOSIS — R946 Abnormal results of thyroid function studies: Secondary | ICD-10-CM | POA: Diagnosis not present

## 2017-12-15 DIAGNOSIS — F3342 Major depressive disorder, recurrent, in full remission: Secondary | ICD-10-CM | POA: Diagnosis not present

## 2017-12-15 DIAGNOSIS — E6609 Other obesity due to excess calories: Secondary | ICD-10-CM | POA: Diagnosis not present

## 2017-12-15 DIAGNOSIS — I10 Essential (primary) hypertension: Secondary | ICD-10-CM | POA: Diagnosis not present

## 2017-12-15 DIAGNOSIS — Z1389 Encounter for screening for other disorder: Secondary | ICD-10-CM | POA: Diagnosis not present

## 2017-12-15 DIAGNOSIS — Z0000 Encounter for general adult medical examination without abnormal findings: Secondary | ICD-10-CM | POA: Diagnosis not present

## 2017-12-15 DIAGNOSIS — J309 Allergic rhinitis, unspecified: Secondary | ICD-10-CM | POA: Diagnosis not present

## 2017-12-15 DIAGNOSIS — Z6830 Body mass index (BMI) 30.0-30.9, adult: Secondary | ICD-10-CM | POA: Diagnosis not present

## 2017-12-15 DIAGNOSIS — Z1211 Encounter for screening for malignant neoplasm of colon: Secondary | ICD-10-CM | POA: Diagnosis not present

## 2017-12-15 DIAGNOSIS — J4530 Mild persistent asthma, uncomplicated: Secondary | ICD-10-CM | POA: Diagnosis not present

## 2017-12-15 DIAGNOSIS — K219 Gastro-esophageal reflux disease without esophagitis: Secondary | ICD-10-CM | POA: Diagnosis not present

## 2017-12-15 DIAGNOSIS — E7800 Pure hypercholesterolemia, unspecified: Secondary | ICD-10-CM | POA: Diagnosis not present

## 2017-12-18 DIAGNOSIS — J3089 Other allergic rhinitis: Secondary | ICD-10-CM | POA: Diagnosis not present

## 2017-12-18 DIAGNOSIS — J3081 Allergic rhinitis due to animal (cat) (dog) hair and dander: Secondary | ICD-10-CM | POA: Diagnosis not present

## 2017-12-18 DIAGNOSIS — J301 Allergic rhinitis due to pollen: Secondary | ICD-10-CM | POA: Diagnosis not present

## 2017-12-22 DIAGNOSIS — J3081 Allergic rhinitis due to animal (cat) (dog) hair and dander: Secondary | ICD-10-CM | POA: Diagnosis not present

## 2017-12-22 DIAGNOSIS — J3089 Other allergic rhinitis: Secondary | ICD-10-CM | POA: Diagnosis not present

## 2017-12-22 DIAGNOSIS — J301 Allergic rhinitis due to pollen: Secondary | ICD-10-CM | POA: Diagnosis not present

## 2017-12-25 DIAGNOSIS — J3081 Allergic rhinitis due to animal (cat) (dog) hair and dander: Secondary | ICD-10-CM | POA: Diagnosis not present

## 2017-12-25 DIAGNOSIS — J3089 Other allergic rhinitis: Secondary | ICD-10-CM | POA: Diagnosis not present

## 2017-12-25 DIAGNOSIS — J301 Allergic rhinitis due to pollen: Secondary | ICD-10-CM | POA: Diagnosis not present

## 2018-01-03 DIAGNOSIS — J3089 Other allergic rhinitis: Secondary | ICD-10-CM | POA: Diagnosis not present

## 2018-01-03 DIAGNOSIS — J3081 Allergic rhinitis due to animal (cat) (dog) hair and dander: Secondary | ICD-10-CM | POA: Diagnosis not present

## 2018-01-03 DIAGNOSIS — J301 Allergic rhinitis due to pollen: Secondary | ICD-10-CM | POA: Diagnosis not present

## 2018-01-09 DIAGNOSIS — J301 Allergic rhinitis due to pollen: Secondary | ICD-10-CM | POA: Diagnosis not present

## 2018-01-09 DIAGNOSIS — J3089 Other allergic rhinitis: Secondary | ICD-10-CM | POA: Diagnosis not present

## 2018-01-11 DIAGNOSIS — E7800 Pure hypercholesterolemia, unspecified: Secondary | ICD-10-CM | POA: Diagnosis not present

## 2018-01-12 DIAGNOSIS — J301 Allergic rhinitis due to pollen: Secondary | ICD-10-CM | POA: Diagnosis not present

## 2018-01-12 DIAGNOSIS — J3089 Other allergic rhinitis: Secondary | ICD-10-CM | POA: Diagnosis not present

## 2018-01-12 DIAGNOSIS — J3081 Allergic rhinitis due to animal (cat) (dog) hair and dander: Secondary | ICD-10-CM | POA: Diagnosis not present

## 2018-01-16 DIAGNOSIS — J3089 Other allergic rhinitis: Secondary | ICD-10-CM | POA: Diagnosis not present

## 2018-01-16 DIAGNOSIS — J3081 Allergic rhinitis due to animal (cat) (dog) hair and dander: Secondary | ICD-10-CM | POA: Diagnosis not present

## 2018-01-16 DIAGNOSIS — J301 Allergic rhinitis due to pollen: Secondary | ICD-10-CM | POA: Diagnosis not present

## 2018-01-23 DIAGNOSIS — J3081 Allergic rhinitis due to animal (cat) (dog) hair and dander: Secondary | ICD-10-CM | POA: Diagnosis not present

## 2018-01-23 DIAGNOSIS — J301 Allergic rhinitis due to pollen: Secondary | ICD-10-CM | POA: Diagnosis not present

## 2018-01-23 DIAGNOSIS — J3089 Other allergic rhinitis: Secondary | ICD-10-CM | POA: Diagnosis not present

## 2018-01-25 DIAGNOSIS — J3089 Other allergic rhinitis: Secondary | ICD-10-CM | POA: Diagnosis not present

## 2018-01-25 DIAGNOSIS — J301 Allergic rhinitis due to pollen: Secondary | ICD-10-CM | POA: Diagnosis not present

## 2018-01-25 DIAGNOSIS — J3081 Allergic rhinitis due to animal (cat) (dog) hair and dander: Secondary | ICD-10-CM | POA: Diagnosis not present

## 2018-01-29 DIAGNOSIS — J3089 Other allergic rhinitis: Secondary | ICD-10-CM | POA: Diagnosis not present

## 2018-01-29 DIAGNOSIS — J301 Allergic rhinitis due to pollen: Secondary | ICD-10-CM | POA: Diagnosis not present

## 2018-01-29 DIAGNOSIS — J3081 Allergic rhinitis due to animal (cat) (dog) hair and dander: Secondary | ICD-10-CM | POA: Diagnosis not present

## 2018-02-09 DIAGNOSIS — J301 Allergic rhinitis due to pollen: Secondary | ICD-10-CM | POA: Diagnosis not present

## 2018-02-09 DIAGNOSIS — J3089 Other allergic rhinitis: Secondary | ICD-10-CM | POA: Diagnosis not present

## 2018-02-09 DIAGNOSIS — J3081 Allergic rhinitis due to animal (cat) (dog) hair and dander: Secondary | ICD-10-CM | POA: Diagnosis not present

## 2018-02-15 DIAGNOSIS — J301 Allergic rhinitis due to pollen: Secondary | ICD-10-CM | POA: Diagnosis not present

## 2018-02-15 DIAGNOSIS — J3081 Allergic rhinitis due to animal (cat) (dog) hair and dander: Secondary | ICD-10-CM | POA: Diagnosis not present

## 2018-02-15 DIAGNOSIS — J3089 Other allergic rhinitis: Secondary | ICD-10-CM | POA: Diagnosis not present

## 2018-02-22 DIAGNOSIS — J3089 Other allergic rhinitis: Secondary | ICD-10-CM | POA: Diagnosis not present

## 2018-02-22 DIAGNOSIS — J301 Allergic rhinitis due to pollen: Secondary | ICD-10-CM | POA: Diagnosis not present

## 2018-02-22 DIAGNOSIS — J3081 Allergic rhinitis due to animal (cat) (dog) hair and dander: Secondary | ICD-10-CM | POA: Diagnosis not present

## 2018-03-02 DIAGNOSIS — J3081 Allergic rhinitis due to animal (cat) (dog) hair and dander: Secondary | ICD-10-CM | POA: Diagnosis not present

## 2018-03-02 DIAGNOSIS — J3089 Other allergic rhinitis: Secondary | ICD-10-CM | POA: Diagnosis not present

## 2018-03-02 DIAGNOSIS — J301 Allergic rhinitis due to pollen: Secondary | ICD-10-CM | POA: Diagnosis not present

## 2018-03-08 ENCOUNTER — Other Ambulatory Visit: Payer: Self-pay | Admitting: Family Medicine

## 2018-03-08 ENCOUNTER — Ambulatory Visit
Admission: RE | Admit: 2018-03-08 | Discharge: 2018-03-08 | Disposition: A | Discharge status: Home / Self Care | Payer: Medicare Other | Source: Ambulatory Visit | Attending: Family Medicine | Admitting: Family Medicine

## 2018-03-08 DIAGNOSIS — J301 Allergic rhinitis due to pollen: Secondary | ICD-10-CM | POA: Diagnosis not present

## 2018-03-08 DIAGNOSIS — J3089 Other allergic rhinitis: Secondary | ICD-10-CM | POA: Diagnosis not present

## 2018-03-08 DIAGNOSIS — M25562 Pain in left knee: Secondary | ICD-10-CM

## 2018-03-08 DIAGNOSIS — M1712 Unilateral primary osteoarthritis, left knee: Secondary | ICD-10-CM | POA: Diagnosis not present

## 2018-03-08 DIAGNOSIS — J3081 Allergic rhinitis due to animal (cat) (dog) hair and dander: Secondary | ICD-10-CM | POA: Diagnosis not present

## 2018-03-08 DIAGNOSIS — R252 Cramp and spasm: Secondary | ICD-10-CM | POA: Diagnosis not present

## 2018-03-15 DIAGNOSIS — J301 Allergic rhinitis due to pollen: Secondary | ICD-10-CM | POA: Diagnosis not present

## 2018-03-15 DIAGNOSIS — J3089 Other allergic rhinitis: Secondary | ICD-10-CM | POA: Diagnosis not present

## 2018-03-15 DIAGNOSIS — J3081 Allergic rhinitis due to animal (cat) (dog) hair and dander: Secondary | ICD-10-CM | POA: Diagnosis not present

## 2018-03-21 DIAGNOSIS — R252 Cramp and spasm: Secondary | ICD-10-CM | POA: Diagnosis not present

## 2018-03-21 DIAGNOSIS — E7800 Pure hypercholesterolemia, unspecified: Secondary | ICD-10-CM | POA: Diagnosis not present

## 2018-04-02 DIAGNOSIS — J3081 Allergic rhinitis due to animal (cat) (dog) hair and dander: Secondary | ICD-10-CM | POA: Diagnosis not present

## 2018-04-02 DIAGNOSIS — J301 Allergic rhinitis due to pollen: Secondary | ICD-10-CM | POA: Diagnosis not present

## 2018-04-02 DIAGNOSIS — J3089 Other allergic rhinitis: Secondary | ICD-10-CM | POA: Diagnosis not present

## 2018-04-10 DIAGNOSIS — J3081 Allergic rhinitis due to animal (cat) (dog) hair and dander: Secondary | ICD-10-CM | POA: Diagnosis not present

## 2018-04-10 DIAGNOSIS — J301 Allergic rhinitis due to pollen: Secondary | ICD-10-CM | POA: Diagnosis not present

## 2018-04-10 DIAGNOSIS — J3089 Other allergic rhinitis: Secondary | ICD-10-CM | POA: Diagnosis not present

## 2018-04-18 DIAGNOSIS — M1712 Unilateral primary osteoarthritis, left knee: Secondary | ICD-10-CM | POA: Diagnosis not present

## 2018-04-18 DIAGNOSIS — J301 Allergic rhinitis due to pollen: Secondary | ICD-10-CM | POA: Diagnosis not present

## 2018-04-18 DIAGNOSIS — J3089 Other allergic rhinitis: Secondary | ICD-10-CM | POA: Diagnosis not present

## 2018-04-18 DIAGNOSIS — J3081 Allergic rhinitis due to animal (cat) (dog) hair and dander: Secondary | ICD-10-CM | POA: Diagnosis not present

## 2018-04-19 DIAGNOSIS — J301 Allergic rhinitis due to pollen: Secondary | ICD-10-CM | POA: Diagnosis not present

## 2018-04-19 DIAGNOSIS — J3089 Other allergic rhinitis: Secondary | ICD-10-CM | POA: Diagnosis not present

## 2018-04-19 DIAGNOSIS — J3081 Allergic rhinitis due to animal (cat) (dog) hair and dander: Secondary | ICD-10-CM | POA: Diagnosis not present

## 2018-04-25 DIAGNOSIS — J3081 Allergic rhinitis due to animal (cat) (dog) hair and dander: Secondary | ICD-10-CM | POA: Diagnosis not present

## 2018-04-25 DIAGNOSIS — J301 Allergic rhinitis due to pollen: Secondary | ICD-10-CM | POA: Diagnosis not present

## 2018-04-25 DIAGNOSIS — J3089 Other allergic rhinitis: Secondary | ICD-10-CM | POA: Diagnosis not present

## 2018-05-03 DIAGNOSIS — J3089 Other allergic rhinitis: Secondary | ICD-10-CM | POA: Diagnosis not present

## 2018-05-03 DIAGNOSIS — J3081 Allergic rhinitis due to animal (cat) (dog) hair and dander: Secondary | ICD-10-CM | POA: Diagnosis not present

## 2018-05-03 DIAGNOSIS — J301 Allergic rhinitis due to pollen: Secondary | ICD-10-CM | POA: Diagnosis not present

## 2018-05-10 DIAGNOSIS — J3089 Other allergic rhinitis: Secondary | ICD-10-CM | POA: Diagnosis not present

## 2018-05-10 DIAGNOSIS — J301 Allergic rhinitis due to pollen: Secondary | ICD-10-CM | POA: Diagnosis not present

## 2018-05-10 DIAGNOSIS — J3081 Allergic rhinitis due to animal (cat) (dog) hair and dander: Secondary | ICD-10-CM | POA: Diagnosis not present

## 2018-05-21 DIAGNOSIS — J3081 Allergic rhinitis due to animal (cat) (dog) hair and dander: Secondary | ICD-10-CM | POA: Diagnosis not present

## 2018-05-21 DIAGNOSIS — J301 Allergic rhinitis due to pollen: Secondary | ICD-10-CM | POA: Diagnosis not present

## 2018-05-21 DIAGNOSIS — J3089 Other allergic rhinitis: Secondary | ICD-10-CM | POA: Diagnosis not present

## 2018-05-28 DIAGNOSIS — J3089 Other allergic rhinitis: Secondary | ICD-10-CM | POA: Diagnosis not present

## 2018-05-28 DIAGNOSIS — J3081 Allergic rhinitis due to animal (cat) (dog) hair and dander: Secondary | ICD-10-CM | POA: Diagnosis not present

## 2018-05-28 DIAGNOSIS — J301 Allergic rhinitis due to pollen: Secondary | ICD-10-CM | POA: Diagnosis not present

## 2018-06-04 DIAGNOSIS — J3089 Other allergic rhinitis: Secondary | ICD-10-CM | POA: Diagnosis not present

## 2018-06-04 DIAGNOSIS — J4520 Mild intermittent asthma, uncomplicated: Secondary | ICD-10-CM | POA: Diagnosis not present

## 2018-06-04 DIAGNOSIS — J3081 Allergic rhinitis due to animal (cat) (dog) hair and dander: Secondary | ICD-10-CM | POA: Diagnosis not present

## 2018-06-04 DIAGNOSIS — J301 Allergic rhinitis due to pollen: Secondary | ICD-10-CM | POA: Diagnosis not present

## 2018-06-07 DIAGNOSIS — J301 Allergic rhinitis due to pollen: Secondary | ICD-10-CM | POA: Diagnosis not present

## 2018-06-07 DIAGNOSIS — J3089 Other allergic rhinitis: Secondary | ICD-10-CM | POA: Diagnosis not present

## 2018-06-07 DIAGNOSIS — J3081 Allergic rhinitis due to animal (cat) (dog) hair and dander: Secondary | ICD-10-CM | POA: Diagnosis not present

## 2018-06-12 DIAGNOSIS — J301 Allergic rhinitis due to pollen: Secondary | ICD-10-CM | POA: Diagnosis not present

## 2018-06-12 DIAGNOSIS — J3081 Allergic rhinitis due to animal (cat) (dog) hair and dander: Secondary | ICD-10-CM | POA: Diagnosis not present

## 2018-06-12 DIAGNOSIS — J3089 Other allergic rhinitis: Secondary | ICD-10-CM | POA: Diagnosis not present

## 2018-06-19 DIAGNOSIS — E7800 Pure hypercholesterolemia, unspecified: Secondary | ICD-10-CM | POA: Diagnosis not present

## 2018-06-19 DIAGNOSIS — F324 Major depressive disorder, single episode, in partial remission: Secondary | ICD-10-CM | POA: Diagnosis not present

## 2018-06-19 DIAGNOSIS — I10 Essential (primary) hypertension: Secondary | ICD-10-CM | POA: Diagnosis not present

## 2018-06-19 DIAGNOSIS — R946 Abnormal results of thyroid function studies: Secondary | ICD-10-CM | POA: Diagnosis not present

## 2018-06-19 DIAGNOSIS — Z713 Dietary counseling and surveillance: Secondary | ICD-10-CM | POA: Diagnosis not present

## 2018-06-19 DIAGNOSIS — K219 Gastro-esophageal reflux disease without esophagitis: Secondary | ICD-10-CM | POA: Diagnosis not present

## 2018-06-19 DIAGNOSIS — E669 Obesity, unspecified: Secondary | ICD-10-CM | POA: Diagnosis not present

## 2018-06-19 DIAGNOSIS — J3089 Other allergic rhinitis: Secondary | ICD-10-CM | POA: Diagnosis not present

## 2018-06-19 DIAGNOSIS — Z23 Encounter for immunization: Secondary | ICD-10-CM | POA: Diagnosis not present

## 2018-06-19 DIAGNOSIS — J301 Allergic rhinitis due to pollen: Secondary | ICD-10-CM | POA: Diagnosis not present

## 2018-06-22 DIAGNOSIS — J3081 Allergic rhinitis due to animal (cat) (dog) hair and dander: Secondary | ICD-10-CM | POA: Diagnosis not present

## 2018-06-22 DIAGNOSIS — J301 Allergic rhinitis due to pollen: Secondary | ICD-10-CM | POA: Diagnosis not present

## 2018-06-22 DIAGNOSIS — J3089 Other allergic rhinitis: Secondary | ICD-10-CM | POA: Diagnosis not present

## 2018-06-27 DIAGNOSIS — J3089 Other allergic rhinitis: Secondary | ICD-10-CM | POA: Diagnosis not present

## 2018-06-27 DIAGNOSIS — J3081 Allergic rhinitis due to animal (cat) (dog) hair and dander: Secondary | ICD-10-CM | POA: Diagnosis not present

## 2018-06-27 DIAGNOSIS — J301 Allergic rhinitis due to pollen: Secondary | ICD-10-CM | POA: Diagnosis not present

## 2018-06-29 DIAGNOSIS — J3081 Allergic rhinitis due to animal (cat) (dog) hair and dander: Secondary | ICD-10-CM | POA: Diagnosis not present

## 2018-06-29 DIAGNOSIS — J3089 Other allergic rhinitis: Secondary | ICD-10-CM | POA: Diagnosis not present

## 2018-06-29 DIAGNOSIS — J301 Allergic rhinitis due to pollen: Secondary | ICD-10-CM | POA: Diagnosis not present

## 2018-07-03 DIAGNOSIS — J3081 Allergic rhinitis due to animal (cat) (dog) hair and dander: Secondary | ICD-10-CM | POA: Diagnosis not present

## 2018-07-03 DIAGNOSIS — J3089 Other allergic rhinitis: Secondary | ICD-10-CM | POA: Diagnosis not present

## 2018-07-03 DIAGNOSIS — J301 Allergic rhinitis due to pollen: Secondary | ICD-10-CM | POA: Diagnosis not present

## 2018-07-11 DIAGNOSIS — J301 Allergic rhinitis due to pollen: Secondary | ICD-10-CM | POA: Diagnosis not present

## 2018-07-11 DIAGNOSIS — J3089 Other allergic rhinitis: Secondary | ICD-10-CM | POA: Diagnosis not present

## 2018-07-11 DIAGNOSIS — J3081 Allergic rhinitis due to animal (cat) (dog) hair and dander: Secondary | ICD-10-CM | POA: Diagnosis not present

## 2018-07-17 DIAGNOSIS — J3089 Other allergic rhinitis: Secondary | ICD-10-CM | POA: Diagnosis not present

## 2018-07-17 DIAGNOSIS — J301 Allergic rhinitis due to pollen: Secondary | ICD-10-CM | POA: Diagnosis not present

## 2018-07-17 DIAGNOSIS — J3081 Allergic rhinitis due to animal (cat) (dog) hair and dander: Secondary | ICD-10-CM | POA: Diagnosis not present

## 2018-07-23 DIAGNOSIS — J3089 Other allergic rhinitis: Secondary | ICD-10-CM | POA: Diagnosis not present

## 2018-07-23 DIAGNOSIS — J3081 Allergic rhinitis due to animal (cat) (dog) hair and dander: Secondary | ICD-10-CM | POA: Diagnosis not present

## 2018-07-23 DIAGNOSIS — J301 Allergic rhinitis due to pollen: Secondary | ICD-10-CM | POA: Diagnosis not present

## 2018-07-26 DIAGNOSIS — H35372 Puckering of macula, left eye: Secondary | ICD-10-CM | POA: Diagnosis not present

## 2018-07-26 DIAGNOSIS — H40013 Open angle with borderline findings, low risk, bilateral: Secondary | ICD-10-CM | POA: Diagnosis not present

## 2018-07-26 DIAGNOSIS — H43811 Vitreous degeneration, right eye: Secondary | ICD-10-CM | POA: Diagnosis not present

## 2018-08-01 DIAGNOSIS — J3089 Other allergic rhinitis: Secondary | ICD-10-CM | POA: Diagnosis not present

## 2018-08-01 DIAGNOSIS — J3081 Allergic rhinitis due to animal (cat) (dog) hair and dander: Secondary | ICD-10-CM | POA: Diagnosis not present

## 2018-08-01 DIAGNOSIS — J301 Allergic rhinitis due to pollen: Secondary | ICD-10-CM | POA: Diagnosis not present

## 2018-08-06 DIAGNOSIS — J3081 Allergic rhinitis due to animal (cat) (dog) hair and dander: Secondary | ICD-10-CM | POA: Diagnosis not present

## 2018-08-06 DIAGNOSIS — J301 Allergic rhinitis due to pollen: Secondary | ICD-10-CM | POA: Diagnosis not present

## 2018-08-06 DIAGNOSIS — J3089 Other allergic rhinitis: Secondary | ICD-10-CM | POA: Diagnosis not present

## 2018-08-16 DIAGNOSIS — J301 Allergic rhinitis due to pollen: Secondary | ICD-10-CM | POA: Diagnosis not present

## 2018-08-16 DIAGNOSIS — J3081 Allergic rhinitis due to animal (cat) (dog) hair and dander: Secondary | ICD-10-CM | POA: Diagnosis not present

## 2018-08-16 DIAGNOSIS — J3089 Other allergic rhinitis: Secondary | ICD-10-CM | POA: Diagnosis not present

## 2018-08-28 DIAGNOSIS — J301 Allergic rhinitis due to pollen: Secondary | ICD-10-CM | POA: Diagnosis not present

## 2018-08-28 DIAGNOSIS — J3089 Other allergic rhinitis: Secondary | ICD-10-CM | POA: Diagnosis not present

## 2018-08-28 DIAGNOSIS — J3081 Allergic rhinitis due to animal (cat) (dog) hair and dander: Secondary | ICD-10-CM | POA: Diagnosis not present

## 2018-09-04 DIAGNOSIS — J3089 Other allergic rhinitis: Secondary | ICD-10-CM | POA: Diagnosis not present

## 2018-09-04 DIAGNOSIS — J3081 Allergic rhinitis due to animal (cat) (dog) hair and dander: Secondary | ICD-10-CM | POA: Diagnosis not present

## 2018-09-04 DIAGNOSIS — J301 Allergic rhinitis due to pollen: Secondary | ICD-10-CM | POA: Diagnosis not present

## 2018-09-18 DIAGNOSIS — J029 Acute pharyngitis, unspecified: Secondary | ICD-10-CM | POA: Diagnosis not present

## 2018-10-23 DIAGNOSIS — J301 Allergic rhinitis due to pollen: Secondary | ICD-10-CM | POA: Diagnosis not present

## 2018-10-23 DIAGNOSIS — J3089 Other allergic rhinitis: Secondary | ICD-10-CM | POA: Diagnosis not present

## 2018-10-23 DIAGNOSIS — J3081 Allergic rhinitis due to animal (cat) (dog) hair and dander: Secondary | ICD-10-CM | POA: Diagnosis not present

## 2018-10-26 DIAGNOSIS — J301 Allergic rhinitis due to pollen: Secondary | ICD-10-CM | POA: Diagnosis not present

## 2018-10-26 DIAGNOSIS — J3081 Allergic rhinitis due to animal (cat) (dog) hair and dander: Secondary | ICD-10-CM | POA: Diagnosis not present

## 2018-10-26 DIAGNOSIS — J3089 Other allergic rhinitis: Secondary | ICD-10-CM | POA: Diagnosis not present

## 2018-11-01 DIAGNOSIS — J3081 Allergic rhinitis due to animal (cat) (dog) hair and dander: Secondary | ICD-10-CM | POA: Diagnosis not present

## 2018-11-01 DIAGNOSIS — J3089 Other allergic rhinitis: Secondary | ICD-10-CM | POA: Diagnosis not present

## 2018-11-01 DIAGNOSIS — J301 Allergic rhinitis due to pollen: Secondary | ICD-10-CM | POA: Diagnosis not present

## 2018-11-06 DIAGNOSIS — J3089 Other allergic rhinitis: Secondary | ICD-10-CM | POA: Diagnosis not present

## 2018-11-06 DIAGNOSIS — J301 Allergic rhinitis due to pollen: Secondary | ICD-10-CM | POA: Diagnosis not present

## 2018-11-06 DIAGNOSIS — J3081 Allergic rhinitis due to animal (cat) (dog) hair and dander: Secondary | ICD-10-CM | POA: Diagnosis not present
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
## 2018-11-22 DIAGNOSIS — J301 Allergic rhinitis due to pollen: Secondary | ICD-10-CM | POA: Diagnosis not present

## 2018-11-22 DIAGNOSIS — J3081 Allergic rhinitis due to animal (cat) (dog) hair and dander: Secondary | ICD-10-CM | POA: Diagnosis not present

## 2018-11-22 DIAGNOSIS — J3089 Other allergic rhinitis: Secondary | ICD-10-CM | POA: Diagnosis not present

## 2018-12-06 DIAGNOSIS — J301 Allergic rhinitis due to pollen: Secondary | ICD-10-CM | POA: Diagnosis not present

## 2018-12-06 DIAGNOSIS — J3081 Allergic rhinitis due to animal (cat) (dog) hair and dander: Secondary | ICD-10-CM | POA: Diagnosis not present

## 2018-12-06 DIAGNOSIS — J3089 Other allergic rhinitis: Secondary | ICD-10-CM | POA: Diagnosis not present

## 2018-12-14 DIAGNOSIS — J3089 Other allergic rhinitis: Secondary | ICD-10-CM | POA: Diagnosis not present

## 2018-12-14 DIAGNOSIS — J301 Allergic rhinitis due to pollen: Secondary | ICD-10-CM | POA: Diagnosis not present

## 2018-12-14 DIAGNOSIS — J3081 Allergic rhinitis due to animal (cat) (dog) hair and dander: Secondary | ICD-10-CM | POA: Diagnosis not present

## 2018-12-17 DIAGNOSIS — J3089 Other allergic rhinitis: Secondary | ICD-10-CM | POA: Diagnosis not present

## 2018-12-17 DIAGNOSIS — J301 Allergic rhinitis due to pollen: Secondary | ICD-10-CM | POA: Diagnosis not present

## 2018-12-17 DIAGNOSIS — J3081 Allergic rhinitis due to animal (cat) (dog) hair and dander: Secondary | ICD-10-CM | POA: Diagnosis not present

## 2018-12-19 DIAGNOSIS — J3081 Allergic rhinitis due to animal (cat) (dog) hair and dander: Secondary | ICD-10-CM | POA: Diagnosis not present

## 2018-12-19 DIAGNOSIS — J3089 Other allergic rhinitis: Secondary | ICD-10-CM | POA: Diagnosis not present

## 2018-12-19 DIAGNOSIS — J301 Allergic rhinitis due to pollen: Secondary | ICD-10-CM | POA: Diagnosis not present

## 2018-12-21 DIAGNOSIS — I10 Essential (primary) hypertension: Secondary | ICD-10-CM | POA: Diagnosis not present

## 2018-12-21 DIAGNOSIS — K219 Gastro-esophageal reflux disease without esophagitis: Secondary | ICD-10-CM | POA: Diagnosis not present

## 2018-12-21 DIAGNOSIS — J4530 Mild persistent asthma, uncomplicated: Secondary | ICD-10-CM | POA: Diagnosis not present

## 2018-12-21 DIAGNOSIS — E2839 Other primary ovarian failure: Secondary | ICD-10-CM | POA: Diagnosis not present

## 2018-12-21 DIAGNOSIS — M8580 Other specified disorders of bone density and structure, unspecified site: Secondary | ICD-10-CM | POA: Diagnosis not present

## 2018-12-21 DIAGNOSIS — Z1389 Encounter for screening for other disorder: Secondary | ICD-10-CM | POA: Diagnosis not present

## 2018-12-21 DIAGNOSIS — E669 Obesity, unspecified: Secondary | ICD-10-CM | POA: Diagnosis not present

## 2018-12-21 DIAGNOSIS — Z0000 Encounter for general adult medical examination without abnormal findings: Secondary | ICD-10-CM | POA: Diagnosis not present

## 2018-12-21 DIAGNOSIS — E7800 Pure hypercholesterolemia, unspecified: Secondary | ICD-10-CM | POA: Diagnosis not present

## 2018-12-21 DIAGNOSIS — R7989 Other specified abnormal findings of blood chemistry: Secondary | ICD-10-CM | POA: Diagnosis not present

## 2018-12-21 DIAGNOSIS — J309 Allergic rhinitis, unspecified: Secondary | ICD-10-CM | POA: Diagnosis not present

## 2018-12-21 DIAGNOSIS — F324 Major depressive disorder, single episode, in partial remission: Secondary | ICD-10-CM | POA: Diagnosis not present

## 2018-12-27 DIAGNOSIS — J3089 Other allergic rhinitis: Secondary | ICD-10-CM | POA: Diagnosis not present

## 2018-12-27 DIAGNOSIS — J301 Allergic rhinitis due to pollen: Secondary | ICD-10-CM | POA: Diagnosis not present

## 2018-12-27 DIAGNOSIS — J3081 Allergic rhinitis due to animal (cat) (dog) hair and dander: Secondary | ICD-10-CM | POA: Diagnosis not present

## 2019-01-01 DIAGNOSIS — J3081 Allergic rhinitis due to animal (cat) (dog) hair and dander: Secondary | ICD-10-CM | POA: Diagnosis not present

## 2019-01-01 DIAGNOSIS — J3089 Other allergic rhinitis: Secondary | ICD-10-CM | POA: Diagnosis not present

## 2019-01-01 DIAGNOSIS — J301 Allergic rhinitis due to pollen: Secondary | ICD-10-CM | POA: Diagnosis not present

## 2019-01-04 DIAGNOSIS — J3081 Allergic rhinitis due to animal (cat) (dog) hair and dander: Secondary | ICD-10-CM | POA: Diagnosis not present

## 2019-01-04 DIAGNOSIS — J3089 Other allergic rhinitis: Secondary | ICD-10-CM | POA: Diagnosis not present

## 2019-01-04 DIAGNOSIS — J301 Allergic rhinitis due to pollen: Secondary | ICD-10-CM | POA: Diagnosis not present

## 2019-01-11 DIAGNOSIS — J3089 Other allergic rhinitis: Secondary | ICD-10-CM | POA: Diagnosis not present

## 2019-01-11 DIAGNOSIS — J3081 Allergic rhinitis due to animal (cat) (dog) hair and dander: Secondary | ICD-10-CM | POA: Diagnosis not present

## 2019-01-11 DIAGNOSIS — J301 Allergic rhinitis due to pollen: Secondary | ICD-10-CM | POA: Diagnosis not present

## 2019-01-17 DIAGNOSIS — J301 Allergic rhinitis due to pollen: Secondary | ICD-10-CM | POA: Diagnosis not present

## 2019-01-17 DIAGNOSIS — J3089 Other allergic rhinitis: Secondary | ICD-10-CM | POA: Diagnosis not present

## 2019-01-25 DIAGNOSIS — J3081 Allergic rhinitis due to animal (cat) (dog) hair and dander: Secondary | ICD-10-CM | POA: Diagnosis not present

## 2019-01-25 DIAGNOSIS — J3089 Other allergic rhinitis: Secondary | ICD-10-CM | POA: Diagnosis not present

## 2019-01-25 DIAGNOSIS — J301 Allergic rhinitis due to pollen: Secondary | ICD-10-CM | POA: Diagnosis not present

## 2019-01-30 DIAGNOSIS — J3089 Other allergic rhinitis: Secondary | ICD-10-CM | POA: Diagnosis not present

## 2019-01-30 DIAGNOSIS — J3081 Allergic rhinitis due to animal (cat) (dog) hair and dander: Secondary | ICD-10-CM | POA: Diagnosis not present

## 2019-01-30 DIAGNOSIS — J301 Allergic rhinitis due to pollen: Secondary | ICD-10-CM | POA: Diagnosis not present

## 2019-02-07 DIAGNOSIS — J3089 Other allergic rhinitis: Secondary | ICD-10-CM | POA: Diagnosis not present

## 2019-02-07 DIAGNOSIS — J3081 Allergic rhinitis due to animal (cat) (dog) hair and dander: Secondary | ICD-10-CM | POA: Diagnosis not present

## 2019-02-07 DIAGNOSIS — J301 Allergic rhinitis due to pollen: Secondary | ICD-10-CM | POA: Diagnosis not present

## 2019-02-14 DIAGNOSIS — J3081 Allergic rhinitis due to animal (cat) (dog) hair and dander: Secondary | ICD-10-CM | POA: Diagnosis not present

## 2019-02-14 DIAGNOSIS — J301 Allergic rhinitis due to pollen: Secondary | ICD-10-CM | POA: Diagnosis not present

## 2019-02-14 DIAGNOSIS — J3089 Other allergic rhinitis: Secondary | ICD-10-CM | POA: Diagnosis not present

## 2019-02-21 DIAGNOSIS — J3081 Allergic rhinitis due to animal (cat) (dog) hair and dander: Secondary | ICD-10-CM | POA: Diagnosis not present

## 2019-02-21 DIAGNOSIS — J3089 Other allergic rhinitis: Secondary | ICD-10-CM | POA: Diagnosis not present

## 2019-02-21 DIAGNOSIS — J301 Allergic rhinitis due to pollen: Secondary | ICD-10-CM | POA: Diagnosis not present

## 2019-03-05 DIAGNOSIS — J3081 Allergic rhinitis due to animal (cat) (dog) hair and dander: Secondary | ICD-10-CM | POA: Diagnosis not present

## 2019-03-05 DIAGNOSIS — J301 Allergic rhinitis due to pollen: Secondary | ICD-10-CM | POA: Diagnosis not present

## 2019-03-05 DIAGNOSIS — J3089 Other allergic rhinitis: Secondary | ICD-10-CM | POA: Diagnosis not present

## 2019-03-15 DIAGNOSIS — J3089 Other allergic rhinitis: Secondary | ICD-10-CM | POA: Diagnosis not present

## 2019-03-15 DIAGNOSIS — J3081 Allergic rhinitis due to animal (cat) (dog) hair and dander: Secondary | ICD-10-CM | POA: Diagnosis not present

## 2019-03-15 DIAGNOSIS — J301 Allergic rhinitis due to pollen: Secondary | ICD-10-CM | POA: Diagnosis not present

## 2019-03-21 DIAGNOSIS — Z780 Asymptomatic menopausal state: Secondary | ICD-10-CM | POA: Diagnosis not present

## 2019-03-21 DIAGNOSIS — Z1231 Encounter for screening mammogram for malignant neoplasm of breast: Secondary | ICD-10-CM | POA: Diagnosis not present

## 2019-03-21 DIAGNOSIS — M810 Age-related osteoporosis without current pathological fracture: Secondary | ICD-10-CM | POA: Diagnosis not present

## 2019-03-22 DIAGNOSIS — J301 Allergic rhinitis due to pollen: Secondary | ICD-10-CM | POA: Diagnosis not present

## 2019-03-22 DIAGNOSIS — J3081 Allergic rhinitis due to animal (cat) (dog) hair and dander: Secondary | ICD-10-CM | POA: Diagnosis not present

## 2019-03-22 DIAGNOSIS — J3089 Other allergic rhinitis: Secondary | ICD-10-CM | POA: Diagnosis not present

## 2019-03-29 DIAGNOSIS — J3081 Allergic rhinitis due to animal (cat) (dog) hair and dander: Secondary | ICD-10-CM | POA: Diagnosis not present

## 2019-03-29 DIAGNOSIS — J3089 Other allergic rhinitis: Secondary | ICD-10-CM | POA: Diagnosis not present

## 2019-03-29 DIAGNOSIS — J301 Allergic rhinitis due to pollen: Secondary | ICD-10-CM | POA: Diagnosis not present

## 2019-04-05 DIAGNOSIS — J3089 Other allergic rhinitis: Secondary | ICD-10-CM | POA: Diagnosis not present

## 2019-04-05 DIAGNOSIS — J301 Allergic rhinitis due to pollen: Secondary | ICD-10-CM | POA: Diagnosis not present

## 2019-04-05 DIAGNOSIS — J3081 Allergic rhinitis due to animal (cat) (dog) hair and dander: Secondary | ICD-10-CM | POA: Diagnosis not present

## 2019-04-09 DIAGNOSIS — J3089 Other allergic rhinitis: Secondary | ICD-10-CM | POA: Diagnosis not present

## 2019-04-09 DIAGNOSIS — J3081 Allergic rhinitis due to animal (cat) (dog) hair and dander: Secondary | ICD-10-CM | POA: Diagnosis not present

## 2019-04-09 DIAGNOSIS — J301 Allergic rhinitis due to pollen: Secondary | ICD-10-CM | POA: Diagnosis not present

## 2019-04-20 ENCOUNTER — Telehealth: Payer: Medicare Other | Admitting: Nurse Practitioner

## 2019-04-20 DIAGNOSIS — R059 Cough, unspecified: Secondary | ICD-10-CM

## 2019-04-20 DIAGNOSIS — R05 Cough: Secondary | ICD-10-CM

## 2019-04-20 MED ORDER — BENZONATATE 100 MG PO CAPS: 100.0000 mg | ORAL_CAPSULE | Freq: Three times a day (TID) | ORAL | 0 refills | Status: DC | PRN

## 2019-04-20 NOTE — Progress Notes (Signed)
E-Visit for Corona Virus Screening   Your current symptoms could be consistent with the coronavirus.  If have sent your information to the [facility] center to request and arrange formal testing. They will be contacting you by phone to make arrangements for testing. I do not believe they are still open today and may not be open on sundays either. It may be MOnday before you are called. If you cannot wait until then you may have to go to the ED. . Many health care providers can now test patients at their office but not all are.  Please quarantine yourself while awaiting your test results.  [place] [phone], [place], [place] or visit [email or web address]     COVID-19 is a respiratory illness with symptoms that are similar to the flu. Symptoms are typically mild to moderate, but there have been cases of severe illness and death due to the virus. The following symptoms may appear 2-14 days after exposure: . Fever . Cough . Shortness of breath or difficulty breathing . Chills . Repeated shaking with chills . Muscle pain . Headache . Sore throat . New loss of taste or smell . Fatigue . Congestion or runny nose . Nausea or vomiting . Diarrhea  It is vitally important that if you feel that you have an infection such as this virus or any other virus that you stay home and away from places where you may spread it to others.  You should self-quarantine for 14 days if you have symptoms that could potentially be coronavirus or have been in close contact a with a person diagnosed with COVID-19 within the last 2 weeks. You should avoid contact with people age 70 and older.   You should wear a mask or cloth face covering over your nose and mouth if you must be around other people or animals, including pets (even at home). Try to stay at least 6 feet away  from other people. This will protect the people around you.  You can use medication such as A prescription cough medication called Tessalon Perles 100 mg. You may take 1-2 capsules every 8 hours as needed for cough  You may also take acetaminophen (Tylenol) as needed for fever.   Reduce your risk of any infection by using the same precautions used for avoiding the common cold or flu:  Marland Kitchen Wash your hands often with soap and warm water for at least 20 seconds.  If soap and water are not readily available, use an alcohol-based hand sanitizer with at least 60% alcohol.  . If coughing or sneezing, cover your mouth and nose by coughing or sneezing into the elbow areas of your shirt or coat, into a tissue or into your sleeve (not your hands). . Avoid shaking hands with others and consider head nods or verbal greetings only. . Avoid touching your eyes, nose, or mouth with unwashed hands.  . Avoid close contact with people who are sick. . Avoid places or events with large numbers of people in one location, like concerts or sporting events. . Carefully consider travel plans you have or are making. . If you are planning any travel outside or inside the [place], visit the CDC's Travelers' Health webpage for the latest health notices. . If you have some symptoms but not all symptoms, continue to monitor at home and seek medical attention if your symptoms worsen. . If you are having a medical emergency, call 911.  HOME CARE .  Only take medications as instructed by your medical team. . Drink plenty of fluids and get plenty of rest. . A steam or ultrasonic humidifier can help if you have congestion.   GET HELP RIGHT AWAY IF YOU HAVE EMERGENCY WARNING SIGNS** FOR COVID-19. If you or someone is showing any of these signs seek emergency medical care immediately. Call 911 or proceed to your closest emergency facility if: . You develop worsening high fever. . Trouble breathing . Bluish lips or face . Persistent  pain or pressure in the chest . New confusion . Inability to wake or stay awake . You cough up blood. . Your symptoms become more severe  **This list is not all possible symptoms. Contact your medical provider for any symptoms that are sever or concerning to you.   MAKE SURE YOU   Understand these instructions.  Will watch your condition.  Will get help right away if you are not doing well or get worse.  Your e-visit answers were reviewed by a board certified advanced clinical practitioner to complete your personal care plan.  Depending on the condition, your plan could have included both over the counter or prescription medications.  If there is a problem please reply once you have received a response from your provider.  Your safety is important to [place].  If you have drug allergies check your prescription carefully.    You can use MyChart to ask questions about today's visit, request a non-urgent call back, or ask for a work or school excuse for 24 hours related to this e-Visit. If it has been greater than 24 hours you will need to follow up with your provider, or enter a new e-Visit to address those concerns. You will get an e-mail in the next two days asking about your experience.  I hope that your e-visit has been valuable and will speed your recovery. Thank you for using e-visits.  5-10 minutes spent reviewing and documenting in chart. Time

## 2019-04-23 DIAGNOSIS — Z1159 Encounter for screening for other viral diseases: Secondary | ICD-10-CM | POA: Diagnosis not present

## 2019-04-30 DIAGNOSIS — J301 Allergic rhinitis due to pollen: Secondary | ICD-10-CM | POA: Diagnosis not present

## 2019-04-30 DIAGNOSIS — J3089 Other allergic rhinitis: Secondary | ICD-10-CM | POA: Diagnosis not present

## 2019-04-30 DIAGNOSIS — J3081 Allergic rhinitis due to animal (cat) (dog) hair and dander: Secondary | ICD-10-CM | POA: Diagnosis not present

## 2019-05-10 DIAGNOSIS — J3089 Other allergic rhinitis: Secondary | ICD-10-CM | POA: Diagnosis not present

## 2019-05-10 DIAGNOSIS — J301 Allergic rhinitis due to pollen: Secondary | ICD-10-CM | POA: Diagnosis not present

## 2019-05-10 DIAGNOSIS — J3081 Allergic rhinitis due to animal (cat) (dog) hair and dander: Secondary | ICD-10-CM | POA: Diagnosis not present

## 2019-05-17 DIAGNOSIS — J3081 Allergic rhinitis due to animal (cat) (dog) hair and dander: Secondary | ICD-10-CM | POA: Diagnosis not present

## 2019-05-17 DIAGNOSIS — J301 Allergic rhinitis due to pollen: Secondary | ICD-10-CM | POA: Diagnosis not present

## 2019-05-17 DIAGNOSIS — J3089 Other allergic rhinitis: Secondary | ICD-10-CM | POA: Diagnosis not present

## 2019-05-23 DIAGNOSIS — J301 Allergic rhinitis due to pollen: Secondary | ICD-10-CM | POA: Diagnosis not present

## 2019-05-23 DIAGNOSIS — J3081 Allergic rhinitis due to animal (cat) (dog) hair and dander: Secondary | ICD-10-CM | POA: Diagnosis not present

## 2019-05-23 DIAGNOSIS — J3089 Other allergic rhinitis: Secondary | ICD-10-CM | POA: Diagnosis not present

## 2019-05-24 DIAGNOSIS — J3081 Allergic rhinitis due to animal (cat) (dog) hair and dander: Secondary | ICD-10-CM | POA: Diagnosis not present

## 2019-05-24 DIAGNOSIS — J301 Allergic rhinitis due to pollen: Secondary | ICD-10-CM | POA: Diagnosis not present

## 2019-05-24 DIAGNOSIS — J3089 Other allergic rhinitis: Secondary | ICD-10-CM | POA: Diagnosis not present

## 2019-05-31 DIAGNOSIS — J3089 Other allergic rhinitis: Secondary | ICD-10-CM | POA: Diagnosis not present

## 2019-05-31 DIAGNOSIS — J3081 Allergic rhinitis due to animal (cat) (dog) hair and dander: Secondary | ICD-10-CM | POA: Diagnosis not present

## 2019-05-31 DIAGNOSIS — J301 Allergic rhinitis due to pollen: Secondary | ICD-10-CM | POA: Diagnosis not present

## 2019-06-05 DIAGNOSIS — J4520 Mild intermittent asthma, uncomplicated: Secondary | ICD-10-CM | POA: Diagnosis not present

## 2019-06-05 DIAGNOSIS — J3089 Other allergic rhinitis: Secondary | ICD-10-CM | POA: Diagnosis not present

## 2019-06-05 DIAGNOSIS — J3081 Allergic rhinitis due to animal (cat) (dog) hair and dander: Secondary | ICD-10-CM | POA: Diagnosis not present

## 2019-06-05 DIAGNOSIS — J301 Allergic rhinitis due to pollen: Secondary | ICD-10-CM | POA: Diagnosis not present

## 2019-06-13 DIAGNOSIS — J301 Allergic rhinitis due to pollen: Secondary | ICD-10-CM | POA: Diagnosis not present

## 2019-06-13 DIAGNOSIS — J3089 Other allergic rhinitis: Secondary | ICD-10-CM | POA: Diagnosis not present

## 2019-06-13 DIAGNOSIS — J3081 Allergic rhinitis due to animal (cat) (dog) hair and dander: Secondary | ICD-10-CM | POA: Diagnosis not present

## 2019-06-19 DIAGNOSIS — J301 Allergic rhinitis due to pollen: Secondary | ICD-10-CM | POA: Diagnosis not present

## 2019-06-19 DIAGNOSIS — J3089 Other allergic rhinitis: Secondary | ICD-10-CM | POA: Diagnosis not present

## 2019-06-19 DIAGNOSIS — J3081 Allergic rhinitis due to animal (cat) (dog) hair and dander: Secondary | ICD-10-CM | POA: Diagnosis not present

## 2019-06-21 DIAGNOSIS — J3081 Allergic rhinitis due to animal (cat) (dog) hair and dander: Secondary | ICD-10-CM | POA: Diagnosis not present

## 2019-06-21 DIAGNOSIS — J3089 Other allergic rhinitis: Secondary | ICD-10-CM | POA: Diagnosis not present

## 2019-06-21 DIAGNOSIS — J301 Allergic rhinitis due to pollen: Secondary | ICD-10-CM | POA: Diagnosis not present

## 2019-06-24 DIAGNOSIS — Z1211 Encounter for screening for malignant neoplasm of colon: Secondary | ICD-10-CM | POA: Diagnosis not present

## 2019-06-26 DIAGNOSIS — M8580 Other specified disorders of bone density and structure, unspecified site: Secondary | ICD-10-CM | POA: Diagnosis not present

## 2019-06-26 DIAGNOSIS — J4530 Mild persistent asthma, uncomplicated: Secondary | ICD-10-CM | POA: Diagnosis not present

## 2019-06-26 DIAGNOSIS — E7800 Pure hypercholesterolemia, unspecified: Secondary | ICD-10-CM | POA: Diagnosis not present

## 2019-06-26 DIAGNOSIS — F324 Major depressive disorder, single episode, in partial remission: Secondary | ICD-10-CM | POA: Diagnosis not present

## 2019-06-26 DIAGNOSIS — E669 Obesity, unspecified: Secondary | ICD-10-CM | POA: Diagnosis not present

## 2019-06-26 DIAGNOSIS — I10 Essential (primary) hypertension: Secondary | ICD-10-CM | POA: Diagnosis not present

## 2019-06-26 DIAGNOSIS — Z1211 Encounter for screening for malignant neoplasm of colon: Secondary | ICD-10-CM | POA: Diagnosis not present

## 2019-06-26 DIAGNOSIS — R946 Abnormal results of thyroid function studies: Secondary | ICD-10-CM | POA: Diagnosis not present

## 2019-06-26 DIAGNOSIS — J309 Allergic rhinitis, unspecified: Secondary | ICD-10-CM | POA: Diagnosis not present

## 2019-06-26 DIAGNOSIS — Z23 Encounter for immunization: Secondary | ICD-10-CM | POA: Diagnosis not present

## 2019-06-26 DIAGNOSIS — R944 Abnormal results of kidney function studies: Secondary | ICD-10-CM | POA: Diagnosis not present

## 2019-06-26 DIAGNOSIS — K219 Gastro-esophageal reflux disease without esophagitis: Secondary | ICD-10-CM | POA: Diagnosis not present

## 2019-07-01 DIAGNOSIS — J301 Allergic rhinitis due to pollen: Secondary | ICD-10-CM | POA: Diagnosis not present

## 2019-07-01 DIAGNOSIS — J3081 Allergic rhinitis due to animal (cat) (dog) hair and dander: Secondary | ICD-10-CM | POA: Diagnosis not present

## 2019-07-01 DIAGNOSIS — J3089 Other allergic rhinitis: Secondary | ICD-10-CM | POA: Diagnosis not present

## 2019-07-08 DIAGNOSIS — J3089 Other allergic rhinitis: Secondary | ICD-10-CM | POA: Diagnosis not present

## 2019-07-08 DIAGNOSIS — J301 Allergic rhinitis due to pollen: Secondary | ICD-10-CM | POA: Diagnosis not present

## 2019-07-08 DIAGNOSIS — J3081 Allergic rhinitis due to animal (cat) (dog) hair and dander: Secondary | ICD-10-CM | POA: Diagnosis not present

## 2019-07-23 DIAGNOSIS — J3081 Allergic rhinitis due to animal (cat) (dog) hair and dander: Secondary | ICD-10-CM | POA: Diagnosis not present

## 2019-07-23 DIAGNOSIS — J3089 Other allergic rhinitis: Secondary | ICD-10-CM | POA: Diagnosis not present

## 2019-07-23 DIAGNOSIS — J301 Allergic rhinitis due to pollen: Secondary | ICD-10-CM | POA: Diagnosis not present

## 2019-07-30 DIAGNOSIS — H35372 Puckering of macula, left eye: Secondary | ICD-10-CM | POA: Diagnosis not present

## 2019-07-30 DIAGNOSIS — H40013 Open angle with borderline findings, low risk, bilateral: Secondary | ICD-10-CM | POA: Diagnosis not present

## 2019-08-01 DIAGNOSIS — J301 Allergic rhinitis due to pollen: Secondary | ICD-10-CM | POA: Diagnosis not present

## 2019-08-01 DIAGNOSIS — J3081 Allergic rhinitis due to animal (cat) (dog) hair and dander: Secondary | ICD-10-CM | POA: Diagnosis not present

## 2019-08-01 DIAGNOSIS — J3089 Other allergic rhinitis: Secondary | ICD-10-CM | POA: Diagnosis not present

## 2019-08-12 DIAGNOSIS — J3089 Other allergic rhinitis: Secondary | ICD-10-CM | POA: Diagnosis not present

## 2019-08-12 DIAGNOSIS — J301 Allergic rhinitis due to pollen: Secondary | ICD-10-CM | POA: Diagnosis not present

## 2019-08-12 DIAGNOSIS — J3081 Allergic rhinitis due to animal (cat) (dog) hair and dander: Secondary | ICD-10-CM | POA: Diagnosis not present

## 2019-08-16 DIAGNOSIS — J3081 Allergic rhinitis due to animal (cat) (dog) hair and dander: Secondary | ICD-10-CM | POA: Diagnosis not present

## 2019-08-16 DIAGNOSIS — J301 Allergic rhinitis due to pollen: Secondary | ICD-10-CM | POA: Diagnosis not present

## 2019-08-16 DIAGNOSIS — J3089 Other allergic rhinitis: Secondary | ICD-10-CM | POA: Diagnosis not present

## 2019-08-23 DIAGNOSIS — J3089 Other allergic rhinitis: Secondary | ICD-10-CM | POA: Diagnosis not present

## 2019-08-23 DIAGNOSIS — J301 Allergic rhinitis due to pollen: Secondary | ICD-10-CM | POA: Diagnosis not present

## 2019-08-23 DIAGNOSIS — J3081 Allergic rhinitis due to animal (cat) (dog) hair and dander: Secondary | ICD-10-CM | POA: Diagnosis not present

## 2019-08-30 DIAGNOSIS — J3081 Allergic rhinitis due to animal (cat) (dog) hair and dander: Secondary | ICD-10-CM | POA: Diagnosis not present

## 2019-08-30 DIAGNOSIS — J301 Allergic rhinitis due to pollen: Secondary | ICD-10-CM | POA: Diagnosis not present

## 2019-08-30 DIAGNOSIS — J3089 Other allergic rhinitis: Secondary | ICD-10-CM | POA: Diagnosis not present

## 2019-08-30 DIAGNOSIS — R195 Other fecal abnormalities: Secondary | ICD-10-CM | POA: Diagnosis not present

## 2019-09-12 DIAGNOSIS — J301 Allergic rhinitis due to pollen: Secondary | ICD-10-CM | POA: Diagnosis not present

## 2019-09-12 DIAGNOSIS — J3089 Other allergic rhinitis: Secondary | ICD-10-CM | POA: Diagnosis not present

## 2019-09-12 DIAGNOSIS — J3081 Allergic rhinitis due to animal (cat) (dog) hair and dander: Secondary | ICD-10-CM | POA: Diagnosis not present

## 2019-10-31 DIAGNOSIS — Z1159 Encounter for screening for other viral diseases: Secondary | ICD-10-CM | POA: Diagnosis not present

## 2019-11-05 DIAGNOSIS — K648 Other hemorrhoids: Secondary | ICD-10-CM | POA: Diagnosis not present

## 2019-11-05 DIAGNOSIS — R195 Other fecal abnormalities: Secondary | ICD-10-CM | POA: Diagnosis not present

## 2019-11-10 ENCOUNTER — Ambulatory Visit: Payer: Medicare Other

## 2019-11-15 ENCOUNTER — Ambulatory Visit: Payer: Medicare Other

## 2019-11-15 DIAGNOSIS — J3081 Allergic rhinitis due to animal (cat) (dog) hair and dander: Secondary | ICD-10-CM | POA: Diagnosis not present

## 2019-11-15 DIAGNOSIS — J3089 Other allergic rhinitis: Secondary | ICD-10-CM | POA: Diagnosis not present

## 2019-11-15 DIAGNOSIS — J301 Allergic rhinitis due to pollen: Secondary | ICD-10-CM | POA: Diagnosis not present

## 2019-11-20 DIAGNOSIS — J4520 Mild intermittent asthma, uncomplicated: Secondary | ICD-10-CM | POA: Diagnosis not present

## 2019-11-20 DIAGNOSIS — J301 Allergic rhinitis due to pollen: Secondary | ICD-10-CM | POA: Diagnosis not present

## 2019-11-20 DIAGNOSIS — J3089 Other allergic rhinitis: Secondary | ICD-10-CM | POA: Diagnosis not present

## 2019-11-20 DIAGNOSIS — J3081 Allergic rhinitis due to animal (cat) (dog) hair and dander: Secondary | ICD-10-CM | POA: Diagnosis not present

## 2019-12-05 DIAGNOSIS — J3081 Allergic rhinitis due to animal (cat) (dog) hair and dander: Secondary | ICD-10-CM | POA: Diagnosis not present

## 2019-12-05 DIAGNOSIS — J3089 Other allergic rhinitis: Secondary | ICD-10-CM | POA: Diagnosis not present

## 2019-12-05 DIAGNOSIS — J301 Allergic rhinitis due to pollen: Secondary | ICD-10-CM | POA: Diagnosis not present

## 2019-12-18 DIAGNOSIS — J3089 Other allergic rhinitis: Secondary | ICD-10-CM | POA: Diagnosis not present

## 2019-12-18 DIAGNOSIS — J301 Allergic rhinitis due to pollen: Secondary | ICD-10-CM | POA: Diagnosis not present

## 2019-12-18 DIAGNOSIS — J3081 Allergic rhinitis due to animal (cat) (dog) hair and dander: Secondary | ICD-10-CM | POA: Diagnosis not present

## 2019-12-25 DIAGNOSIS — R7989 Other specified abnormal findings of blood chemistry: Secondary | ICD-10-CM | POA: Diagnosis not present

## 2019-12-25 DIAGNOSIS — E669 Obesity, unspecified: Secondary | ICD-10-CM | POA: Diagnosis not present

## 2019-12-25 DIAGNOSIS — J4530 Mild persistent asthma, uncomplicated: Secondary | ICD-10-CM | POA: Diagnosis not present

## 2019-12-25 DIAGNOSIS — R946 Abnormal results of thyroid function studies: Secondary | ICD-10-CM | POA: Diagnosis not present

## 2019-12-25 DIAGNOSIS — I10 Essential (primary) hypertension: Secondary | ICD-10-CM | POA: Diagnosis not present

## 2019-12-25 DIAGNOSIS — E7800 Pure hypercholesterolemia, unspecified: Secondary | ICD-10-CM | POA: Diagnosis not present

## 2019-12-25 DIAGNOSIS — F324 Major depressive disorder, single episode, in partial remission: Secondary | ICD-10-CM | POA: Diagnosis not present

## 2019-12-25 DIAGNOSIS — J301 Allergic rhinitis due to pollen: Secondary | ICD-10-CM | POA: Diagnosis not present

## 2019-12-25 DIAGNOSIS — J309 Allergic rhinitis, unspecified: Secondary | ICD-10-CM | POA: Diagnosis not present

## 2019-12-25 DIAGNOSIS — N1831 Chronic kidney disease, stage 3a: Secondary | ICD-10-CM | POA: Diagnosis not present

## 2019-12-25 DIAGNOSIS — J3081 Allergic rhinitis due to animal (cat) (dog) hair and dander: Secondary | ICD-10-CM | POA: Diagnosis not present

## 2019-12-25 DIAGNOSIS — Z0000 Encounter for general adult medical examination without abnormal findings: Secondary | ICD-10-CM | POA: Diagnosis not present

## 2019-12-25 DIAGNOSIS — K219 Gastro-esophageal reflux disease without esophagitis: Secondary | ICD-10-CM | POA: Diagnosis not present

## 2019-12-25 DIAGNOSIS — R002 Palpitations: Secondary | ICD-10-CM | POA: Diagnosis not present

## 2019-12-25 DIAGNOSIS — Z1389 Encounter for screening for other disorder: Secondary | ICD-10-CM | POA: Diagnosis not present

## 2019-12-25 DIAGNOSIS — J3089 Other allergic rhinitis: Secondary | ICD-10-CM | POA: Diagnosis not present

## 2019-12-31 DIAGNOSIS — J301 Allergic rhinitis due to pollen: Secondary | ICD-10-CM | POA: Diagnosis not present

## 2019-12-31 DIAGNOSIS — J3081 Allergic rhinitis due to animal (cat) (dog) hair and dander: Secondary | ICD-10-CM | POA: Diagnosis not present

## 2019-12-31 DIAGNOSIS — J3089 Other allergic rhinitis: Secondary | ICD-10-CM | POA: Diagnosis not present

## 2020-01-02 DIAGNOSIS — E7800 Pure hypercholesterolemia, unspecified: Secondary | ICD-10-CM | POA: Diagnosis not present

## 2020-01-02 DIAGNOSIS — F3342 Major depressive disorder, recurrent, in full remission: Secondary | ICD-10-CM | POA: Diagnosis not present

## 2020-01-02 DIAGNOSIS — F324 Major depressive disorder, single episode, in partial remission: Secondary | ICD-10-CM | POA: Diagnosis not present

## 2020-01-02 DIAGNOSIS — J4530 Mild persistent asthma, uncomplicated: Secondary | ICD-10-CM | POA: Diagnosis not present

## 2020-01-02 DIAGNOSIS — N183 Chronic kidney disease, stage 3 unspecified: Secondary | ICD-10-CM | POA: Diagnosis not present

## 2020-01-02 DIAGNOSIS — I10 Essential (primary) hypertension: Secondary | ICD-10-CM | POA: Diagnosis not present

## 2020-01-08 DIAGNOSIS — J3081 Allergic rhinitis due to animal (cat) (dog) hair and dander: Secondary | ICD-10-CM | POA: Diagnosis not present

## 2020-01-08 DIAGNOSIS — J301 Allergic rhinitis due to pollen: Secondary | ICD-10-CM | POA: Diagnosis not present

## 2020-01-08 DIAGNOSIS — J3089 Other allergic rhinitis: Secondary | ICD-10-CM | POA: Diagnosis not present

## 2020-01-16 DIAGNOSIS — J301 Allergic rhinitis due to pollen: Secondary | ICD-10-CM | POA: Diagnosis not present

## 2020-01-16 DIAGNOSIS — J3081 Allergic rhinitis due to animal (cat) (dog) hair and dander: Secondary | ICD-10-CM | POA: Diagnosis not present

## 2020-01-16 DIAGNOSIS — J3089 Other allergic rhinitis: Secondary | ICD-10-CM | POA: Diagnosis not present

## 2020-01-21 ENCOUNTER — Encounter: Payer: Self-pay | Admitting: Podiatry

## 2020-01-21 ENCOUNTER — Other Ambulatory Visit: Payer: Self-pay

## 2020-01-21 ENCOUNTER — Ambulatory Visit (INDEPENDENT_AMBULATORY_CARE_PROVIDER_SITE_OTHER): Payer: Medicare Other

## 2020-01-21 ENCOUNTER — Ambulatory Visit (INDEPENDENT_AMBULATORY_CARE_PROVIDER_SITE_OTHER): Payer: Medicare Other | Admitting: Podiatry

## 2020-01-21 DIAGNOSIS — M779 Enthesopathy, unspecified: Secondary | ICD-10-CM

## 2020-01-21 DIAGNOSIS — D3610 Benign neoplasm of peripheral nerves and autonomic nervous system, unspecified: Secondary | ICD-10-CM

## 2020-01-21 DIAGNOSIS — M79671 Pain in right foot: Secondary | ICD-10-CM

## 2020-01-21 DIAGNOSIS — M7741 Metatarsalgia, right foot: Secondary | ICD-10-CM

## 2020-01-21 MED ORDER — MELOXICAM 15 MG PO TABS: 15.0000 mg | ORAL_TABLET | Freq: Every day | ORAL | 0 refills | Status: AC

## 2020-01-21 MED ORDER — MELOXICAM 15 MG PO TABS: 15.0000 mg | ORAL_TABLET | Freq: Every day | ORAL | 0 refills | Status: DC

## 2020-01-21 NOTE — Patient Instructions (Addendum)
Look at getting Voltaren gel    Morton Neuralgia  Morton neuralgia is foot pain that affects the ball of the foot and the area near the toes. Morton neuralgia occurs when part of a nerve in the foot (digital nerve) is under too much pressure (compressed). When this happens over a long period of time, the nerve can thicken (neuroma) and cause pain. Pain usually occurs between the third and fourth toes.  Morton neuralgia can come and go but may get worse over time. What are the causes? This condition is caused by doing the same things over and over with your foot, such as:  Activities such as running or jumping.  Wearing shoes that are too tight. What increases the risk? You may be at higher risk for Morton neuralgia if you:  Are female.  Wear high heels.  Wear shoes that are narrow or tight.  Do activities that repeatedly stretch your toes, such as: ? Running. ? [place]. ? Long-distance walking. What are the signs or symptoms? The first symptom of Morton neuralgia is pain that spreads from the ball of the foot to the toes. It may feel like you are walking on a marble. Pain usually gets worse with walking and goes away at night. Other symptoms may include numbness and cramping of your toes. Both feet are equally affected, but rarely at the same time. How is this diagnosed? This condition is diagnosed based on your symptoms, your medical history, and a physical exam. Your health care provider may:  Squeeze your foot just behind your toe.  Ask you to move your toes to check for pain.  Ask about your physical activity level. You also may have imaging tests, such as an X-ray, ultrasound, or MRI. How is this treated? Treatment depends on how severe your condition is and what causes it. Treatment may involve:  Wearing different shoes that are not too tight, are low-heeled, and provide good support. For some people, this is the only treatment needed.  Wearing an over-the-counter or  custom supportive pad (orthotic) under the front of your foot.  Getting injections of numbing medicine and anti-inflammatory medicine (steroid) in the nerve.  Having surgery to remove part of the thickened nerve. Follow these instructions at home: Managing pain, stiffness, and swelling   Massage your foot as needed.  Wear orthotics as told by your health care provider.  If directed, put ice on your foot: ? Put ice in a plastic bag. ? Place a towel between your skin and the bag. ? Leave the ice on for 20 minutes, 2-3 times a day.  Avoid activities that cause pain or make pain worse. If you play sports, ask your health care provider when it is safe for you to return to sports.  Raise (elevate) your foot above the level of your heart while lying down and, when possible, while sitting. General instructions  Take over-the-counter and prescription medicines only as told by your health care provider.  Do not drive or use heavy machinery while taking prescription pain medicine.  Wear shoes that: ? Have soft soles. ? Have a wide toe area. ? Provide arch support. ? Do not pinch or squeeze your feet. ? Have room for your orthotics, if applicable.  Keep all follow-up visits as told by your health care provider. This is important. Contact a health care provider if:  Your symptoms get worse or do not get better with treatment and home care. Summary  Morton neuralgia is foot pain that  affects the ball of the foot and the area near the toes. Pain usually occurs between the third and fourth toes, gets worse with walking, and goes away at night.  Morton neuralgia occurs when part of a nerve in the foot (digital nerve) is under too much pressure. When this happens over a long period of time, the nerve can thicken (neuroma) and cause pain.  This condition is caused by doing the same things over and over with your foot, such as running or jumping, wearing shoes that are too tight, or wearing  high heels.  Treatment may involve wearing low-heeled shoes that are not too tight, wearing a supportive pad (orthotic) under the front of your foot, getting injections in the nerve, or having surgery to remove part of the thickened nerve. This information is not intended to replace advice given to you by your health care provider. Make sure you discuss any questions you have with your health care provider. Document Revised: 10/10/2017 Document Reviewed: 10/10/2017 Elsevier Patient Education  2020 [employer].

## 2020-01-22 ENCOUNTER — Other Ambulatory Visit: Payer: Self-pay | Admitting: Podiatry

## 2020-01-22 DIAGNOSIS — J3089 Other allergic rhinitis: Secondary | ICD-10-CM | POA: Diagnosis not present

## 2020-01-22 DIAGNOSIS — J301 Allergic rhinitis due to pollen: Secondary | ICD-10-CM | POA: Diagnosis not present

## 2020-01-22 DIAGNOSIS — M779 Enthesopathy, unspecified: Secondary | ICD-10-CM

## 2020-01-22 DIAGNOSIS — J3081 Allergic rhinitis due to animal (cat) (dog) hair and dander: Secondary | ICD-10-CM | POA: Diagnosis not present

## 2020-01-22 DIAGNOSIS — D3610 Benign neoplasm of peripheral nerves and autonomic nervous system, unspecified: Secondary | ICD-10-CM

## 2020-01-22 NOTE — Progress Notes (Signed)
Subjective:   Patient ID: [name], female   DOB: 69 y.o.   MRN: [id number]   HPI 69 year old female presents the office today for concerns of pain to the right foot in the ball of foot.  She think she has a neuroma in her second interspace of the right foot and she had some numbness to the second toe.  She still has similar symptoms on the left side but not symptomatic of the right side.  She thinks that she has arthritis in her left big toe as she gets achiness and is dependent on the weather.  She has purchased new shoes, inserts from the [employer] [facility].  No recent injury to her foot.  No other concerns today.   Review of Systems  All other systems reviewed and are negative.  Past Medical History:  Diagnosis Date  . Asthma   . Depression   . Reflux     Past Surgical History:  Procedure Laterality Date  . WRIST SURGERY Left      Current Outpatient Medications:  .  albuterol (VENTOLIN HFA) 108 (90 Base) MCG/ACT inhaler, Inhale into the lungs every 6 (six) hours as needed for wheezing or shortness of breath., Disp: , Rfl:  .  azelastine (OPTIVAR) 0.05 % ophthalmic solution, 1 drop 2 (two) times daily., Disp: , Rfl:  .  Calcium Carb-Cholecalciferol (CALCIUM + D3 PO), Take by mouth., Disp: , Rfl:  .  fluticasone (FLONASE) 50 MCG/ACT nasal spray, Place into both nostrils daily., Disp: , Rfl:  .  losartan (COZAAR) 100 MG tablet, Take 100 mg by mouth daily., Disp: , Rfl:  .  b complex vitamins tablet, Take 1 tablet by mouth daily., Disp: , Rfl:  .  BREO ELLIPTA 200-25 MCG/INH AEPB, Inhale 1 puff into the lungs daily., Disp: , Rfl:  .  buPROPion (WELLBUTRIN XL) 150 MG 24 hr tablet, Take 150 mg by mouth every morning., Disp: , Rfl:  .  famotidine (PEPCID) 40 MG tablet, Take 40 mg by mouth 2 (two) times daily., Disp: , Rfl:  .  meloxicam (MOBIC) 15 MG tablet, Take 1 tablet (15 mg total) by mouth daily., Disp: 30 tablet, Rfl: 0 .  montelukast (SINGULAIR) 10 MG tablet, Take 10 mg by  mouth daily., Disp: , Rfl:   Allergies  Allergen Reactions  . Tramadol Other (See Comments)    dizzy  . Tramadol Other (See Comments)    dizziness          Objective:  Physical Exam  General: AAO x3, NAD  Dermatological: Skin is warm, dry and supple bilateral. Nails x 10 are well manicured; remaining integument appears unremarkable at this time. There are no open sores, no preulcerative lesions, no rash or signs of infection present.  Vascular: Dorsalis Pedis artery and Posterior Tibial artery pedal pulses are 2/4 bilateral with immedate capillary fill time. There is no pain with calf compression, swelling, warmth, erythema.   Neruologic: Grossly intact via light touch bilateral.  Negative Tinel sign.  Musculoskeletal: There is prominence of metatarsal heads plantarly bilaterally.  Decreased range of motion of first MPJ in the left side.  On the right side there is tenderness palpation of the second interspace and this is where she is describing some burning sensation.  Muscular strength 5/5 in all groups tested bilateral.  Gait: Unassisted, Nonantalgic.       Assessment:   Right foot neuroma/metatarsalgia, hallux limitus left side     Plan:  -Treatment options discussed including  all alternatives, risks, and complications -Etiology of symptoms were discussed -X-rays were obtained and reviewed with the patient.  Arthritic changes present of the left first MPJ.  There is no evidence of acute fracture identified otherwise. -Steroid injection for the right second interspace was performed.  The skin was cleaned with alcohol and a mixture of 1 cc Kenalog 10, 0.5 cc of Marcaine plain, 0.5 cc of lidocaine plain was infiltrated into the area of tenderness without complications.  Postinjection care discussed.  Metatarsal pads were dispensed.  Continue supportive shoes, inserts. -Can use Voltaren gel as well for the left First MPJ. -Prescribed mobic to take as needed. Discussed side  effects of the medication and directed to stop if any are to occur and call the office.   Trula Slade DPM

## 2020-01-31 DIAGNOSIS — J301 Allergic rhinitis due to pollen: Secondary | ICD-10-CM | POA: Diagnosis not present

## 2020-01-31 DIAGNOSIS — J3089 Other allergic rhinitis: Secondary | ICD-10-CM | POA: Diagnosis not present

## 2020-01-31 DIAGNOSIS — J3081 Allergic rhinitis due to animal (cat) (dog) hair and dander: Secondary | ICD-10-CM | POA: Diagnosis not present

## 2020-02-04 DIAGNOSIS — J301 Allergic rhinitis due to pollen: Secondary | ICD-10-CM | POA: Diagnosis not present

## 2020-02-04 DIAGNOSIS — J3089 Other allergic rhinitis: Secondary | ICD-10-CM | POA: Diagnosis not present

## 2020-02-04 DIAGNOSIS — J3081 Allergic rhinitis due to animal (cat) (dog) hair and dander: Secondary | ICD-10-CM | POA: Diagnosis not present

## 2020-02-11 ENCOUNTER — Ambulatory Visit: Payer: Medicare Other | Admitting: Cardiovascular Disease

## 2020-02-18 DIAGNOSIS — J301 Allergic rhinitis due to pollen: Secondary | ICD-10-CM | POA: Diagnosis not present

## 2020-02-18 DIAGNOSIS — J3089 Other allergic rhinitis: Secondary | ICD-10-CM | POA: Diagnosis not present

## 2020-02-18 DIAGNOSIS — J3081 Allergic rhinitis due to animal (cat) (dog) hair and dander: Secondary | ICD-10-CM | POA: Diagnosis not present

## 2020-02-27 DIAGNOSIS — J3089 Other allergic rhinitis: Secondary | ICD-10-CM | POA: Diagnosis not present

## 2020-02-27 DIAGNOSIS — J3081 Allergic rhinitis due to animal (cat) (dog) hair and dander: Secondary | ICD-10-CM | POA: Diagnosis not present

## 2020-02-27 DIAGNOSIS — J301 Allergic rhinitis due to pollen: Secondary | ICD-10-CM | POA: Diagnosis not present

## 2020-03-03 ENCOUNTER — Other Ambulatory Visit: Payer: Self-pay

## 2020-03-03 ENCOUNTER — Ambulatory Visit (INDEPENDENT_AMBULATORY_CARE_PROVIDER_SITE_OTHER): Payer: Medicare Other | Admitting: Podiatry

## 2020-03-03 DIAGNOSIS — D3610 Benign neoplasm of peripheral nerves and autonomic nervous system, unspecified: Secondary | ICD-10-CM | POA: Diagnosis not present

## 2020-03-03 DIAGNOSIS — M779 Enthesopathy, unspecified: Secondary | ICD-10-CM | POA: Diagnosis not present

## 2020-03-03 DIAGNOSIS — M7741 Metatarsalgia, right foot: Secondary | ICD-10-CM

## 2020-03-03 DIAGNOSIS — J3089 Other allergic rhinitis: Secondary | ICD-10-CM | POA: Diagnosis not present

## 2020-03-03 DIAGNOSIS — J3081 Allergic rhinitis due to animal (cat) (dog) hair and dander: Secondary | ICD-10-CM | POA: Diagnosis not present

## 2020-03-03 DIAGNOSIS — J301 Allergic rhinitis due to pollen: Secondary | ICD-10-CM | POA: Diagnosis not present

## 2020-03-05 ENCOUNTER — Other Ambulatory Visit: Payer: Self-pay

## 2020-03-05 ENCOUNTER — Encounter: Payer: Self-pay | Admitting: Cardiovascular Disease

## 2020-03-05 ENCOUNTER — Ambulatory Visit (INDEPENDENT_AMBULATORY_CARE_PROVIDER_SITE_OTHER): Payer: Medicare Other | Admitting: Cardiovascular Disease

## 2020-03-05 VITALS — BP 116/90 | HR 64 | Ht 64.0 in | Wt 170.2 lb

## 2020-03-05 DIAGNOSIS — I10 Essential (primary) hypertension: Secondary | ICD-10-CM | POA: Diagnosis not present

## 2020-03-05 DIAGNOSIS — R079 Chest pain, unspecified: Secondary | ICD-10-CM

## 2020-03-05 DIAGNOSIS — R0789 Other chest pain: Secondary | ICD-10-CM | POA: Diagnosis not present

## 2020-03-05 NOTE — Progress Notes (Signed)
Cardiology Office Note:    Date:  03/05/2020   ID:  [name], DOB 05-Oct-1951, MRN [id number]  PCP:  Lois Huxley, PA  Cardiologist:  Mertie Moores, MD  Electrophysiologist:  None   Referring MD: Lois Huxley, [place]   Chief Complaint  Patient presents with  . Chest Pain    History of Present Illness:    [name] is a 69 y.o. female with a hx of  HTN , HLD, obesity .  We were asked to see her by Dr. Deneise Lever for further evaluation of her chest pain and palpitations.   Has been having some chest heaviness , particularly in the evening  Not associated with deep breath,  Not worsened with twisting her torso  Occurs primarily when she is in  Bed Exercises regularly , several times a week ,  No CP with that  Has occasionaly heart racing sensation.  Might last for 1/2 min or so .  Quit smoking 40 years ago    Past Medical History:  Diagnosis Date  . Allergic rhinitis   . Asthma   . Body mass index (BMI) of 30.0-30.9 in adult   . Chest pain   . CKD (chronic kidney disease), stage III   . Depression   . Estrogen deficiency   . GERD (gastroesophageal reflux disease)   . Hypercholesterolemia   . Hypertension   . Incisional hernia without obstruction or gangrene   . Major depression single episode, in partial remission ([place])   . Major depressive disorder, recurrent episode, in full remission ([place])   . Mild persistent asthma without complication   . Obesity   . Other obesity due to excess calories   . Palpitations   . Reflux     Past Surgical History:  Procedure Laterality Date  . WRIST SURGERY Left     Current Medications: Current Meds  Medication Sig  . albuterol (VENTOLIN HFA) 108 (90 Base) MCG/ACT inhaler Inhale into the lungs every 6 (six) hours as needed for wheezing or shortness of breath.  Marland Kitchen b complex vitamins tablet Take 1 tablet by mouth daily.  Marland Kitchen BREO ELLIPTA 200-25 MCG/INH AEPB Inhale 1 puff into the lungs daily.  Marland Kitchen buPROPion (WELLBUTRIN XL)  150 MG 24 hr tablet Take 150 mg by mouth every morning.  . Calcium Carb-Cholecalciferol (CALCIUM + D3 PO) Take by mouth.  . EPINEPHrine 0.3 mg/0.3 mL IJ SOAJ injection Inject 0.3 mg into the muscle as needed.  . famotidine (PEPCID) 40 MG tablet Take 40 mg by mouth 2 (two) times daily.  . fluticasone (FLONASE) 50 MCG/ACT nasal spray Place into both nostrils daily.  Marland Kitchen levocetirizine (XYZAL) 5 MG tablet Take 5 mg by mouth daily.  Marland Kitchen losartan (COZAAR) 100 MG tablet Take 100 mg by mouth daily.  . meloxicam (MOBIC) 15 MG tablet Take 1 tablet (15 mg total) by mouth daily.  . montelukast (SINGULAIR) 10 MG tablet Take 10 mg by mouth daily.     Allergies:   Tramadol and Tramadol   Social History   Socioeconomic History  . Marital status: Widowed    Spouse name: Not on file  . Number of children: Not on file  . Years of education: Not on file  . Highest education level: Not on file  Occupational History  . Not on file  Tobacco Use  . Smoking status: Unknown If Ever Smoked  . Smokeless tobacco: Never Used  Substance and Sexual Activity  . Alcohol use: Not on  file  . Drug use: Not on file  . Sexual activity: Not on file  Other Topics Concern  . Not on file  Social History Narrative  . Not on file   Social Determinants of Health   Financial Resource Strain:   . Difficulty of Paying Living Expenses:   Food Insecurity:   . Worried About Charity fundraiser in the Last Year:   . Arboriculturist in the Last Year:   Transportation Needs:   . Film/video editor (Medical):   Marland Kitchen Lack of Transportation (Non-Medical):   Physical Activity:   . Days of Exercise per Week:   . Minutes of Exercise per Session:   Stress:   . Feeling of Stress :   Social Connections:   . Frequency of Communication with Friends and Family:   . Frequency of Social Gatherings with Friends and Family:   . Attends Religious Services:   . Active Member of Clubs or Organizations:   . Attends Archivist  Meetings:   Marland Kitchen Marital Status:      Family History: The patient's family history includes Diabetes in her mother; Hypertension in her mother.  ROS:   Please see the history of present illness.     All other systems reviewed and are negative.  EKGs/Labs/Other Studies Reviewed:    The following studies were reviewed today:   EKG:   Mar 05, 2020:  NSR at 97.  No sT or T wave changes.   Recent Labs: No results found for requested labs within last 8760 hours.  Recent Lipid Panel No results found for: CHOL, TRIG, HDL, CHOLHDL, VLDL, LDLCALC, LDLDIRECT  Physical Exam:    VS:  BP 116/90   Pulse 64   Ht 5\' 4"  (1.626 m)   Wt 170 lb 4 oz (77.2 kg)   SpO2 97%   BMI 29.22 kg/m     Wt Readings from Last 3 Encounters:  03/05/20 170 lb 4 oz (77.2 kg)  12/26/16 145 lb (65.8 kg)  03/06/14 182 lb (82.6 kg)     GEN:  Well nourished, well developed in no acute distress HEENT: Normal NECK: No JVD; No carotid bruits LYMPHATICS: No lymphadenopathy CARDIAC: RRR, no murmurs, rubs, gallops RESPIRATORY:  Clear to auscultation without rales, wheezing or rhonchi  ABDOMEN: Soft, non-tender, non-distended MUSCULOSKELETAL:  No edema; No deformity  SKIN: Warm and dry NEUROLOGIC:  Alert and oriented x 3 PSYCHIATRIC:  Normal affect   ASSESSMENT:    1. Essential hypertension   2. Atypical chest pain    PLAN:    In order of problems listed above:  1. Atypical chest pain: Luria presents for further evaluation of chest pain.  Her chest pain is really quite atypical.  She exercises on a regular basis but does not have any episodes of chest pain or shortness of breath.  Her chest pains occur primarily at night when she is lying down.  I have asked her to try drinking some Mylanta to see if that takes care of these episodes of chest pain.  She has no EKG changes.  Her lipids are only minimally elevated.  I have encouraged her to continue with a good diet, exercise, weight loss program.  Continue  same medications.  We will see her back on an as-needed basis.  2.  Hypertension: Blood pressure is very well controlled.  Continue current medications.  She will continue to follow-up with her primary medical doctor.   Medication Adjustments/Labs and Tests  Ordered: Current medicines are reviewed at length with the patient today.  Concerns regarding medicines are outlined above.  Orders Placed This Encounter  Procedures  . EKG 12-Lead   No orders of the defined types were placed in this encounter.   Patient Instructions  Medication Instructions:  Your physician recommends that you continue on your current medications as directed. Please refer to the Current Medication list given to you today.  *If you need a refill on your cardiac medications before your next appointment, please call your pharmacy*   Lab Work: None Ordered If you have labs (blood work) drawn today and your tests are completely normal, you will receive your results only by: Marland Kitchen MyChart Message (if you have MyChart) OR . A paper copy in the mail If you have any lab test that is abnormal or we need to change your treatment, we will call you to review the results.   Testing/Procedures: None Ordered   Follow-Up: At [facility], you and your health needs are our priority.  As part of our continuing mission to provide you with exceptional heart care, we have created designated Provider Care Teams.  These Care Teams include your primary Cardiologist (physician) and Advanced Practice Providers (APPs -  Physician Assistants and Nurse Practitioners) who all work together to provide you with the care you need, when you need it.  We recommend signing up for the patient portal called "MyChart".  Sign up information is provided on this After Visit Summary.  MyChart is used to connect with patients for Virtual Visits (Telemedicine).  Patients are able to view lab/test results, encounter notes, upcoming appointments, etc.   Non-urgent messages can be sent to your provider as well.   To learn more about what you can do with MyChart, go to [email or web address].    Your next appointment:    As Needed  The format for your next appointment:   Either In Person or Virtual  Provider:   You may see Mertie Moores, MD or one of the following Advanced Practice Providers on your designated Care Team:    Richardson Dopp, PA-C  Robbie Lis, [place]       Signed, Mertie Moores, MD  03/05/2020 9:29 AM    [place]

## 2020-03-05 NOTE — Patient Instructions (Signed)
Medication Instructions:  Your physician recommends that you continue on your current medications as directed. Please refer to the Current Medication list given to you today.  *If you need a refill on your cardiac medications before your next appointment, please call your pharmacy*   Lab Work: None Ordered If you have labs (blood work) drawn today and your tests are completely normal, you will receive your results only by: . MyChart Message (if you have MyChart) OR . A paper copy in the mail If you have any lab test that is abnormal or we need to change your treatment, we will call you to review the results.   Testing/Procedures: None Ordered   Follow-Up: At [facility], you and your health needs are our priority.  As part of our continuing mission to provide you with exceptional heart care, we have created designated Provider Care Teams.  These Care Teams include your primary Cardiologist (physician) and Advanced Practice Providers (APPs -  Physician Assistants and Nurse Practitioners) who all work together to provide you with the care you need, when you need it.  We recommend signing up for the patient portal called "MyChart".  Sign up information is provided on this After Visit Summary.  MyChart is used to connect with patients for Virtual Visits (Telemedicine).  Patients are able to view lab/test results, encounter notes, upcoming appointments, etc.  Non-urgent messages can be sent to your provider as well.   To learn more about what you can do with MyChart, go to [email or web address].    Your next appointment:    As Needed  The format for your next appointment:   Either In Person or Virtual  Provider:   You may see Philip Nahser, MD or one of the following Advanced Practice Providers on your designated Care Team:    Scott Weaver, PA-C  Vin Bhagat, PA-C     

## 2020-03-11 NOTE — Progress Notes (Signed)
Subjective: 69 year old female presents the office today for follow-up evaluation of right foot pain, neuroma/metatarsalgia.  She is also gets similar symptoms of the left foot: Second toe.  She states that pain is intermittent and she if she walks for about 30 minutes she will start to get symptoms.  No recent injury or changes otherwise. Denies any systemic complaints such as fevers, chills, nausea, vomiting. No acute changes since last appointment, and no other complaints at this time.   Objective: AAO x3, NAD DP/PT pulses palpable bilaterally, CRT less than 3 seconds Proximal metatarsal plantarly with atrophy of the fat pad.  Take decreasing to motion of the first MPJ on the left side.  The right side tenderness still in the second interspace.  No area of pinpoint tenderness.  Flexor, extensor tendons are intact. No open lesions or pre-ulcerative lesions.  No pain with calf compression, swelling, warmth, erythema  Assessment: Neuroma, metatarsalgia right side with hallux limitus left side  Plan: -All treatment options discussed with the patient including all alternatives, risks, complications.  -Offered steroid injection today.  She is in the Voltaren gel.  Discussed shoe modifications as well as inserts.  Offloading pads dispensed. -Patient encouraged to call the office with any questions, concerns, change in symptoms.   Trula Slade DPM

## 2020-03-13 ENCOUNTER — Telehealth: Payer: Self-pay | Admitting: *Deleted

## 2020-03-13 NOTE — Telephone Encounter (Signed)
Called and left a message for the patient to see if the patient wanted a 90 day supply of meloxicam due to the pharmacy is sending over a request for it or just use the voltaren gel that Dr Jacqualyn Posey prescribed. Lattie Haw

## 2020-03-19 DIAGNOSIS — J3089 Other allergic rhinitis: Secondary | ICD-10-CM | POA: Diagnosis not present

## 2020-03-19 DIAGNOSIS — J3081 Allergic rhinitis due to animal (cat) (dog) hair and dander: Secondary | ICD-10-CM | POA: Diagnosis not present

## 2020-03-19 DIAGNOSIS — J301 Allergic rhinitis due to pollen: Secondary | ICD-10-CM | POA: Diagnosis not present

## 2020-04-21 ENCOUNTER — Other Ambulatory Visit: Payer: Self-pay

## 2020-04-21 ENCOUNTER — Ambulatory Visit (INDEPENDENT_AMBULATORY_CARE_PROVIDER_SITE_OTHER): Payer: Medicare Other | Admitting: Podiatry

## 2020-04-21 DIAGNOSIS — J3089 Other allergic rhinitis: Secondary | ICD-10-CM | POA: Diagnosis not present

## 2020-04-21 DIAGNOSIS — R609 Edema, unspecified: Secondary | ICD-10-CM | POA: Diagnosis not present

## 2020-04-21 DIAGNOSIS — D3610 Benign neoplasm of peripheral nerves and autonomic nervous system, unspecified: Secondary | ICD-10-CM | POA: Diagnosis not present

## 2020-04-21 DIAGNOSIS — J301 Allergic rhinitis due to pollen: Secondary | ICD-10-CM | POA: Diagnosis not present

## 2020-04-22 NOTE — Progress Notes (Signed)
Subjective: 69 year old female presents the office today initially for follow-up evaluation of a neuroma, metatarsalgia in the right foot.  She states that about 95% better.  She states that she has developed swelling to the left ankle.  She did follow-up with her primary care physician for this but as it was not causing any problems and wanted to observe this.  She was sent this evaluated today as well.  She denies recent injury or falls. Denies any systemic complaints such as fevers, chills, nausea, vomiting. No acute changes since last appointment, and no other complaints at this time.   Objective: AAO x3, NAD DP/PT pulses palpable bilaterally, CRT less than 3 seconds There is increased warmth of the left foot and ankle compared to the contralateral extremity as well as the left lower extremity.  Varicose veins are present there is no pain with calf compression the calf is supple.  There is no erythema or warmth.  Ankle, subtalar range of motion intact.  No areas of tenderness.  Flexor, extensor tendons appear to be intact.  No pain with calf compression, swelling, warmth, erythema  Assessment: 69 year old female with left lower extremity edema  Plan: -All treatment options discussed with the patient including all alternatives, risks, complications.  -She has no other symptoms of DVT and the calf is supple.  There is no pain.  I do think this is more from a venous insufficiency issue possibly.Boot compression wrap is applied.  Precautions were advised on when to remove this.  Encouraged elevation.  Limit salt intake.  Follow-up with primary care physician. -Consider venous reflux study if no improvement swelling. -Patient encouraged to call the office with any questions, concerns, change in symptoms.   Trula Slade DPM

## 2020-04-24 DIAGNOSIS — J301 Allergic rhinitis due to pollen: Secondary | ICD-10-CM | POA: Diagnosis not present

## 2020-04-24 DIAGNOSIS — J3089 Other allergic rhinitis: Secondary | ICD-10-CM | POA: Diagnosis not present

## 2020-04-24 DIAGNOSIS — J3081 Allergic rhinitis due to animal (cat) (dog) hair and dander: Secondary | ICD-10-CM | POA: Diagnosis not present

## 2020-04-28 DIAGNOSIS — Z1231 Encounter for screening mammogram for malignant neoplasm of breast: Secondary | ICD-10-CM | POA: Diagnosis not present

## 2020-05-01 DIAGNOSIS — J3089 Other allergic rhinitis: Secondary | ICD-10-CM | POA: Diagnosis not present

## 2020-05-01 DIAGNOSIS — J3081 Allergic rhinitis due to animal (cat) (dog) hair and dander: Secondary | ICD-10-CM | POA: Diagnosis not present

## 2020-05-01 DIAGNOSIS — J301 Allergic rhinitis due to pollen: Secondary | ICD-10-CM | POA: Diagnosis not present

## 2020-05-07 DIAGNOSIS — J3081 Allergic rhinitis due to animal (cat) (dog) hair and dander: Secondary | ICD-10-CM | POA: Diagnosis not present

## 2020-05-07 DIAGNOSIS — J301 Allergic rhinitis due to pollen: Secondary | ICD-10-CM | POA: Diagnosis not present

## 2020-05-07 DIAGNOSIS — J3089 Other allergic rhinitis: Secondary | ICD-10-CM | POA: Diagnosis not present

## 2020-05-20 DIAGNOSIS — J301 Allergic rhinitis due to pollen: Secondary | ICD-10-CM | POA: Diagnosis not present

## 2020-05-20 DIAGNOSIS — J3081 Allergic rhinitis due to animal (cat) (dog) hair and dander: Secondary | ICD-10-CM | POA: Diagnosis not present

## 2020-05-20 DIAGNOSIS — J3089 Other allergic rhinitis: Secondary | ICD-10-CM | POA: Diagnosis not present

## 2020-05-26 DIAGNOSIS — J301 Allergic rhinitis due to pollen: Secondary | ICD-10-CM | POA: Diagnosis not present

## 2020-05-26 DIAGNOSIS — J3081 Allergic rhinitis due to animal (cat) (dog) hair and dander: Secondary | ICD-10-CM | POA: Diagnosis not present

## 2020-05-26 DIAGNOSIS — J3089 Other allergic rhinitis: Secondary | ICD-10-CM | POA: Diagnosis not present

## 2020-06-03 DIAGNOSIS — J3081 Allergic rhinitis due to animal (cat) (dog) hair and dander: Secondary | ICD-10-CM | POA: Diagnosis not present

## 2020-06-03 DIAGNOSIS — J301 Allergic rhinitis due to pollen: Secondary | ICD-10-CM | POA: Diagnosis not present

## 2020-06-03 DIAGNOSIS — J3089 Other allergic rhinitis: Secondary | ICD-10-CM | POA: Diagnosis not present

## 2020-06-12 DIAGNOSIS — J301 Allergic rhinitis due to pollen: Secondary | ICD-10-CM | POA: Diagnosis not present

## 2020-06-12 DIAGNOSIS — J3081 Allergic rhinitis due to animal (cat) (dog) hair and dander: Secondary | ICD-10-CM | POA: Diagnosis not present

## 2020-06-12 DIAGNOSIS — J3089 Other allergic rhinitis: Secondary | ICD-10-CM | POA: Diagnosis not present

## 2020-06-17 DIAGNOSIS — I10 Essential (primary) hypertension: Secondary | ICD-10-CM | POA: Diagnosis not present

## 2020-06-17 DIAGNOSIS — J4530 Mild persistent asthma, uncomplicated: Secondary | ICD-10-CM | POA: Diagnosis not present

## 2020-06-17 DIAGNOSIS — E7800 Pure hypercholesterolemia, unspecified: Secondary | ICD-10-CM | POA: Diagnosis not present

## 2020-06-17 DIAGNOSIS — N1831 Chronic kidney disease, stage 3a: Secondary | ICD-10-CM | POA: Diagnosis not present

## 2020-06-17 DIAGNOSIS — N183 Chronic kidney disease, stage 3 unspecified: Secondary | ICD-10-CM | POA: Diagnosis not present

## 2020-06-17 DIAGNOSIS — F3342 Major depressive disorder, recurrent, in full remission: Secondary | ICD-10-CM | POA: Diagnosis not present

## 2020-06-17 DIAGNOSIS — F324 Major depressive disorder, single episode, in partial remission: Secondary | ICD-10-CM | POA: Diagnosis not present

## 2020-06-19 DIAGNOSIS — J3081 Allergic rhinitis due to animal (cat) (dog) hair and dander: Secondary | ICD-10-CM | POA: Diagnosis not present

## 2020-06-19 DIAGNOSIS — J3089 Other allergic rhinitis: Secondary | ICD-10-CM | POA: Diagnosis not present

## 2020-06-19 DIAGNOSIS — J301 Allergic rhinitis due to pollen: Secondary | ICD-10-CM | POA: Diagnosis not present

## 2020-06-26 DIAGNOSIS — J3081 Allergic rhinitis due to animal (cat) (dog) hair and dander: Secondary | ICD-10-CM | POA: Diagnosis not present

## 2020-06-26 DIAGNOSIS — J3089 Other allergic rhinitis: Secondary | ICD-10-CM | POA: Diagnosis not present

## 2020-06-26 DIAGNOSIS — J301 Allergic rhinitis due to pollen: Secondary | ICD-10-CM | POA: Diagnosis not present

## 2020-06-30 DIAGNOSIS — Z23 Encounter for immunization: Secondary | ICD-10-CM | POA: Diagnosis not present

## 2020-06-30 DIAGNOSIS — K219 Gastro-esophageal reflux disease without esophagitis: Secondary | ICD-10-CM | POA: Diagnosis not present

## 2020-06-30 DIAGNOSIS — E669 Obesity, unspecified: Secondary | ICD-10-CM | POA: Diagnosis not present

## 2020-06-30 DIAGNOSIS — J309 Allergic rhinitis, unspecified: Secondary | ICD-10-CM | POA: Diagnosis not present

## 2020-06-30 DIAGNOSIS — N1831 Chronic kidney disease, stage 3a: Secondary | ICD-10-CM | POA: Diagnosis not present

## 2020-06-30 DIAGNOSIS — I10 Essential (primary) hypertension: Secondary | ICD-10-CM | POA: Diagnosis not present

## 2020-06-30 DIAGNOSIS — R609 Edema, unspecified: Secondary | ICD-10-CM | POA: Diagnosis not present

## 2020-06-30 DIAGNOSIS — E7800 Pure hypercholesterolemia, unspecified: Secondary | ICD-10-CM | POA: Diagnosis not present

## 2020-06-30 DIAGNOSIS — F324 Major depressive disorder, single episode, in partial remission: Secondary | ICD-10-CM | POA: Diagnosis not present

## 2020-06-30 DIAGNOSIS — J4530 Mild persistent asthma, uncomplicated: Secondary | ICD-10-CM | POA: Diagnosis not present

## 2020-07-02 DIAGNOSIS — J3089 Other allergic rhinitis: Secondary | ICD-10-CM | POA: Diagnosis not present

## 2020-07-02 DIAGNOSIS — J3081 Allergic rhinitis due to animal (cat) (dog) hair and dander: Secondary | ICD-10-CM | POA: Diagnosis not present

## 2020-07-02 DIAGNOSIS — J301 Allergic rhinitis due to pollen: Secondary | ICD-10-CM | POA: Diagnosis not present

## 2020-07-09 DIAGNOSIS — J3089 Other allergic rhinitis: Secondary | ICD-10-CM | POA: Diagnosis not present

## 2020-07-09 DIAGNOSIS — J3081 Allergic rhinitis due to animal (cat) (dog) hair and dander: Secondary | ICD-10-CM | POA: Diagnosis not present

## 2020-07-09 DIAGNOSIS — J301 Allergic rhinitis due to pollen: Secondary | ICD-10-CM | POA: Diagnosis not present

## 2020-07-17 DIAGNOSIS — J3089 Other allergic rhinitis: Secondary | ICD-10-CM | POA: Diagnosis not present

## 2020-07-17 DIAGNOSIS — J301 Allergic rhinitis due to pollen: Secondary | ICD-10-CM | POA: Diagnosis not present

## 2020-07-17 DIAGNOSIS — J3081 Allergic rhinitis due to animal (cat) (dog) hair and dander: Secondary | ICD-10-CM | POA: Diagnosis not present

## 2020-07-22 DIAGNOSIS — J4520 Mild intermittent asthma, uncomplicated: Secondary | ICD-10-CM | POA: Diagnosis not present

## 2020-07-22 DIAGNOSIS — J3089 Other allergic rhinitis: Secondary | ICD-10-CM | POA: Diagnosis not present

## 2020-07-22 DIAGNOSIS — J3081 Allergic rhinitis due to animal (cat) (dog) hair and dander: Secondary | ICD-10-CM | POA: Diagnosis not present

## 2020-07-22 DIAGNOSIS — J301 Allergic rhinitis due to pollen: Secondary | ICD-10-CM | POA: Diagnosis not present

## 2020-07-30 DIAGNOSIS — J3089 Other allergic rhinitis: Secondary | ICD-10-CM | POA: Diagnosis not present

## 2020-07-30 DIAGNOSIS — H35372 Puckering of macula, left eye: Secondary | ICD-10-CM | POA: Diagnosis not present

## 2020-07-30 DIAGNOSIS — H40013 Open angle with borderline findings, low risk, bilateral: Secondary | ICD-10-CM | POA: Diagnosis not present

## 2020-07-30 DIAGNOSIS — J301 Allergic rhinitis due to pollen: Secondary | ICD-10-CM | POA: Diagnosis not present

## 2020-07-30 DIAGNOSIS — J3081 Allergic rhinitis due to animal (cat) (dog) hair and dander: Secondary | ICD-10-CM | POA: Diagnosis not present

## 2020-08-05 DIAGNOSIS — Z23 Encounter for immunization: Secondary | ICD-10-CM | POA: Diagnosis not present

## 2020-08-10 ENCOUNTER — Ambulatory Visit (INDEPENDENT_AMBULATORY_CARE_PROVIDER_SITE_OTHER): Payer: Medicare Other | Admitting: Podiatry

## 2020-08-10 ENCOUNTER — Other Ambulatory Visit: Payer: Self-pay

## 2020-08-10 DIAGNOSIS — M7741 Metatarsalgia, right foot: Secondary | ICD-10-CM | POA: Diagnosis not present

## 2020-08-10 DIAGNOSIS — M779 Enthesopathy, unspecified: Secondary | ICD-10-CM | POA: Diagnosis not present

## 2020-08-10 DIAGNOSIS — M79671 Pain in right foot: Secondary | ICD-10-CM | POA: Diagnosis not present

## 2020-08-11 NOTE — Progress Notes (Signed)
Subjective: 69 year old female presents the office today for evaluation of recurrent right foot pain.  She points on the second interspace as well as second digit where she has the majority discomfort.  She states that 2 weeks ago the area is more severe but has improved significantly but she still has discomfort.  She states it feels the same as it did previously when she had the injection.  She denies any recent injury or trauma or change in activity level. Denies any systemic complaints such as fevers, chills, nausea, vomiting. No acute changes since last appointment, and no other complaints at this time.   Objective: AAO x3, NAD DP/PT pulses palpable bilaterally, CRT less than 3 seconds There is tenderness on secondof the right foot as well as the second MPJ.  There is minimal edema but there is no erythema or warmth.  There is no pain with vibratory sensation of the metatarsals or toes.  The toes in rectus position.  MMT 5/5. No open lesions or pre-ulcerative lesions.  No pain with calf compression, swelling, warmth, erythema  Assessment: 69 year old female capsulitis/tendinitis right foot  Plan: -All treatment options discussed with the patient including all alternatives, risks, complications.  -Steroid injection performed.  The skin was Prepped with alcohol and mixture of 1 cc Kenalog 10, 0.5 cc of Marcaine plain, 0.5 cc of lidocaine plain was infiltrated into the second interspace along the area of maximal tenderness without complications.  Postinjection care discussed.  She tolerated well. -Metatarsal offloading pads dispensed -Continue with supportive shoes.  Discussed orthotics. -Patient encouraged to call the office with any questions, concerns, change in symptoms.   Trula Slade DPM

## 2020-08-14 DIAGNOSIS — J301 Allergic rhinitis due to pollen: Secondary | ICD-10-CM | POA: Diagnosis not present

## 2020-08-14 DIAGNOSIS — J3081 Allergic rhinitis due to animal (cat) (dog) hair and dander: Secondary | ICD-10-CM | POA: Diagnosis not present

## 2020-08-14 DIAGNOSIS — J3089 Other allergic rhinitis: Secondary | ICD-10-CM | POA: Diagnosis not present

## 2020-08-20 DIAGNOSIS — J3089 Other allergic rhinitis: Secondary | ICD-10-CM | POA: Diagnosis not present

## 2020-08-20 DIAGNOSIS — J301 Allergic rhinitis due to pollen: Secondary | ICD-10-CM | POA: Diagnosis not present

## 2020-08-20 DIAGNOSIS — J3081 Allergic rhinitis due to animal (cat) (dog) hair and dander: Secondary | ICD-10-CM | POA: Diagnosis not present

## 2020-08-23 DIAGNOSIS — S97122A Crushing injury of left lesser toe(s), initial encounter: Secondary | ICD-10-CM | POA: Diagnosis not present

## 2020-08-23 DIAGNOSIS — I10 Essential (primary) hypertension: Secondary | ICD-10-CM | POA: Diagnosis not present

## 2020-08-27 DIAGNOSIS — J3081 Allergic rhinitis due to animal (cat) (dog) hair and dander: Secondary | ICD-10-CM | POA: Diagnosis not present

## 2020-08-27 DIAGNOSIS — J3089 Other allergic rhinitis: Secondary | ICD-10-CM | POA: Diagnosis not present

## 2020-08-27 DIAGNOSIS — J301 Allergic rhinitis due to pollen: Secondary | ICD-10-CM | POA: Diagnosis not present

## 2020-09-10 DIAGNOSIS — J301 Allergic rhinitis due to pollen: Secondary | ICD-10-CM | POA: Diagnosis not present

## 2020-09-10 DIAGNOSIS — J3081 Allergic rhinitis due to animal (cat) (dog) hair and dander: Secondary | ICD-10-CM | POA: Diagnosis not present

## 2020-09-10 DIAGNOSIS — J3089 Other allergic rhinitis: Secondary | ICD-10-CM | POA: Diagnosis not present

## 2020-09-30 DIAGNOSIS — F3342 Major depressive disorder, recurrent, in full remission: Secondary | ICD-10-CM | POA: Diagnosis not present

## 2020-09-30 DIAGNOSIS — J301 Allergic rhinitis due to pollen: Secondary | ICD-10-CM | POA: Diagnosis not present

## 2020-09-30 DIAGNOSIS — I10 Essential (primary) hypertension: Secondary | ICD-10-CM | POA: Diagnosis not present

## 2020-09-30 DIAGNOSIS — J4530 Mild persistent asthma, uncomplicated: Secondary | ICD-10-CM | POA: Diagnosis not present

## 2020-09-30 DIAGNOSIS — J3089 Other allergic rhinitis: Secondary | ICD-10-CM | POA: Diagnosis not present

## 2020-09-30 DIAGNOSIS — E7800 Pure hypercholesterolemia, unspecified: Secondary | ICD-10-CM | POA: Diagnosis not present

## 2020-09-30 DIAGNOSIS — J3081 Allergic rhinitis due to animal (cat) (dog) hair and dander: Secondary | ICD-10-CM | POA: Diagnosis not present

## 2020-09-30 DIAGNOSIS — K219 Gastro-esophageal reflux disease without esophagitis: Secondary | ICD-10-CM | POA: Diagnosis not present

## 2020-09-30 DIAGNOSIS — N183 Chronic kidney disease, stage 3 unspecified: Secondary | ICD-10-CM | POA: Diagnosis not present

## 2020-10-15 ENCOUNTER — Other Ambulatory Visit: Payer: Medicare Other

## 2020-10-15 DIAGNOSIS — Z20822 Contact with and (suspected) exposure to covid-19: Secondary | ICD-10-CM

## 2020-10-17 LAB — NOVEL CORONAVIRUS, NAA: SARS-CoV-2, NAA: NOT DETECTED

## 2020-10-17 LAB — SARS-COV-2, NAA 2 DAY TAT

## 2020-11-02 DIAGNOSIS — J3089 Other allergic rhinitis: Secondary | ICD-10-CM | POA: Diagnosis not present

## 2020-11-02 DIAGNOSIS — J3081 Allergic rhinitis due to animal (cat) (dog) hair and dander: Secondary | ICD-10-CM | POA: Diagnosis not present

## 2020-11-02 DIAGNOSIS — J301 Allergic rhinitis due to pollen: Secondary | ICD-10-CM | POA: Diagnosis not present

## 2020-11-13 DIAGNOSIS — J3081 Allergic rhinitis due to animal (cat) (dog) hair and dander: Secondary | ICD-10-CM | POA: Diagnosis not present

## 2020-11-13 DIAGNOSIS — J301 Allergic rhinitis due to pollen: Secondary | ICD-10-CM | POA: Diagnosis not present

## 2020-11-13 DIAGNOSIS — J3089 Other allergic rhinitis: Secondary | ICD-10-CM | POA: Diagnosis not present

## 2020-11-20 DIAGNOSIS — J301 Allergic rhinitis due to pollen: Secondary | ICD-10-CM | POA: Diagnosis not present

## 2020-11-20 DIAGNOSIS — J3081 Allergic rhinitis due to animal (cat) (dog) hair and dander: Secondary | ICD-10-CM | POA: Diagnosis not present

## 2020-11-20 DIAGNOSIS — J3089 Other allergic rhinitis: Secondary | ICD-10-CM | POA: Diagnosis not present

## 2020-11-26 DIAGNOSIS — J3081 Allergic rhinitis due to animal (cat) (dog) hair and dander: Secondary | ICD-10-CM | POA: Diagnosis not present

## 2020-11-26 DIAGNOSIS — J3089 Other allergic rhinitis: Secondary | ICD-10-CM | POA: Diagnosis not present

## 2020-11-26 DIAGNOSIS — J301 Allergic rhinitis due to pollen: Secondary | ICD-10-CM | POA: Diagnosis not present

## 2020-12-04 DIAGNOSIS — J3089 Other allergic rhinitis: Secondary | ICD-10-CM | POA: Diagnosis not present

## 2020-12-04 DIAGNOSIS — J3081 Allergic rhinitis due to animal (cat) (dog) hair and dander: Secondary | ICD-10-CM | POA: Diagnosis not present

## 2020-12-04 DIAGNOSIS — J301 Allergic rhinitis due to pollen: Secondary | ICD-10-CM | POA: Diagnosis not present

## 2020-12-10 DIAGNOSIS — J301 Allergic rhinitis due to pollen: Secondary | ICD-10-CM | POA: Diagnosis not present

## 2020-12-10 DIAGNOSIS — J3089 Other allergic rhinitis: Secondary | ICD-10-CM | POA: Diagnosis not present

## 2020-12-10 DIAGNOSIS — J3081 Allergic rhinitis due to animal (cat) (dog) hair and dander: Secondary | ICD-10-CM | POA: Diagnosis not present

## 2020-12-14 DIAGNOSIS — J3089 Other allergic rhinitis: Secondary | ICD-10-CM | POA: Diagnosis not present

## 2020-12-14 DIAGNOSIS — J3081 Allergic rhinitis due to animal (cat) (dog) hair and dander: Secondary | ICD-10-CM | POA: Diagnosis not present

## 2020-12-14 DIAGNOSIS — J301 Allergic rhinitis due to pollen: Secondary | ICD-10-CM | POA: Diagnosis not present

## 2020-12-24 DIAGNOSIS — J3089 Other allergic rhinitis: Secondary | ICD-10-CM | POA: Diagnosis not present

## 2020-12-24 DIAGNOSIS — J301 Allergic rhinitis due to pollen: Secondary | ICD-10-CM | POA: Diagnosis not present

## 2020-12-24 DIAGNOSIS — J3081 Allergic rhinitis due to animal (cat) (dog) hair and dander: Secondary | ICD-10-CM | POA: Diagnosis not present

## 2020-12-25 DIAGNOSIS — Z1389 Encounter for screening for other disorder: Secondary | ICD-10-CM | POA: Diagnosis not present

## 2020-12-25 DIAGNOSIS — K219 Gastro-esophageal reflux disease without esophagitis: Secondary | ICD-10-CM | POA: Diagnosis not present

## 2020-12-25 DIAGNOSIS — J309 Allergic rhinitis, unspecified: Secondary | ICD-10-CM | POA: Diagnosis not present

## 2020-12-25 DIAGNOSIS — N1831 Chronic kidney disease, stage 3a: Secondary | ICD-10-CM | POA: Diagnosis not present

## 2020-12-25 DIAGNOSIS — E2839 Other primary ovarian failure: Secondary | ICD-10-CM | POA: Diagnosis not present

## 2020-12-25 DIAGNOSIS — J4530 Mild persistent asthma, uncomplicated: Secondary | ICD-10-CM | POA: Diagnosis not present

## 2020-12-25 DIAGNOSIS — E7800 Pure hypercholesterolemia, unspecified: Secondary | ICD-10-CM | POA: Diagnosis not present

## 2020-12-25 DIAGNOSIS — R609 Edema, unspecified: Secondary | ICD-10-CM | POA: Diagnosis not present

## 2020-12-25 DIAGNOSIS — F324 Major depressive disorder, single episode, in partial remission: Secondary | ICD-10-CM | POA: Diagnosis not present

## 2020-12-25 DIAGNOSIS — Z0000 Encounter for general adult medical examination without abnormal findings: Secondary | ICD-10-CM | POA: Diagnosis not present

## 2020-12-25 DIAGNOSIS — I10 Essential (primary) hypertension: Secondary | ICD-10-CM | POA: Diagnosis not present

## 2020-12-25 DIAGNOSIS — R946 Abnormal results of thyroid function studies: Secondary | ICD-10-CM | POA: Diagnosis not present

## 2020-12-29 DIAGNOSIS — E7800 Pure hypercholesterolemia, unspecified: Secondary | ICD-10-CM | POA: Diagnosis not present

## 2020-12-29 DIAGNOSIS — F3342 Major depressive disorder, recurrent, in full remission: Secondary | ICD-10-CM | POA: Diagnosis not present

## 2020-12-29 DIAGNOSIS — I10 Essential (primary) hypertension: Secondary | ICD-10-CM | POA: Diagnosis not present

## 2020-12-29 DIAGNOSIS — K219 Gastro-esophageal reflux disease without esophagitis: Secondary | ICD-10-CM | POA: Diagnosis not present

## 2020-12-29 DIAGNOSIS — N183 Chronic kidney disease, stage 3 unspecified: Secondary | ICD-10-CM | POA: Diagnosis not present

## 2020-12-29 DIAGNOSIS — F324 Major depressive disorder, single episode, in partial remission: Secondary | ICD-10-CM | POA: Diagnosis not present

## 2020-12-29 DIAGNOSIS — J4530 Mild persistent asthma, uncomplicated: Secondary | ICD-10-CM | POA: Diagnosis not present

## 2020-12-29 DIAGNOSIS — N1831 Chronic kidney disease, stage 3a: Secondary | ICD-10-CM | POA: Diagnosis not present

## 2021-01-01 DIAGNOSIS — J3089 Other allergic rhinitis: Secondary | ICD-10-CM | POA: Diagnosis not present

## 2021-01-01 DIAGNOSIS — J3081 Allergic rhinitis due to animal (cat) (dog) hair and dander: Secondary | ICD-10-CM | POA: Diagnosis not present

## 2021-01-01 DIAGNOSIS — J301 Allergic rhinitis due to pollen: Secondary | ICD-10-CM | POA: Diagnosis not present

## 2021-01-08 DIAGNOSIS — J3081 Allergic rhinitis due to animal (cat) (dog) hair and dander: Secondary | ICD-10-CM | POA: Diagnosis not present

## 2021-01-08 DIAGNOSIS — J301 Allergic rhinitis due to pollen: Secondary | ICD-10-CM | POA: Diagnosis not present

## 2021-01-08 DIAGNOSIS — J3089 Other allergic rhinitis: Secondary | ICD-10-CM | POA: Diagnosis not present

## 2021-01-11 DIAGNOSIS — Z23 Encounter for immunization: Secondary | ICD-10-CM | POA: Diagnosis not present

## 2021-01-13 DIAGNOSIS — E7800 Pure hypercholesterolemia, unspecified: Secondary | ICD-10-CM | POA: Diagnosis not present

## 2021-01-13 DIAGNOSIS — F3342 Major depressive disorder, recurrent, in full remission: Secondary | ICD-10-CM | POA: Diagnosis not present

## 2021-01-13 DIAGNOSIS — N183 Chronic kidney disease, stage 3 unspecified: Secondary | ICD-10-CM | POA: Diagnosis not present

## 2021-01-13 DIAGNOSIS — K219 Gastro-esophageal reflux disease without esophagitis: Secondary | ICD-10-CM | POA: Diagnosis not present

## 2021-01-13 DIAGNOSIS — J4530 Mild persistent asthma, uncomplicated: Secondary | ICD-10-CM | POA: Diagnosis not present

## 2021-01-13 DIAGNOSIS — I10 Essential (primary) hypertension: Secondary | ICD-10-CM | POA: Diagnosis not present

## 2021-01-18 DIAGNOSIS — Z780 Asymptomatic menopausal state: Secondary | ICD-10-CM | POA: Diagnosis not present

## 2021-01-18 DIAGNOSIS — J301 Allergic rhinitis due to pollen: Secondary | ICD-10-CM | POA: Diagnosis not present

## 2021-01-18 DIAGNOSIS — J3089 Other allergic rhinitis: Secondary | ICD-10-CM | POA: Diagnosis not present

## 2021-01-18 DIAGNOSIS — M8589 Other specified disorders of bone density and structure, multiple sites: Secondary | ICD-10-CM | POA: Diagnosis not present

## 2021-01-18 DIAGNOSIS — J3081 Allergic rhinitis due to animal (cat) (dog) hair and dander: Secondary | ICD-10-CM | POA: Diagnosis not present

## 2021-02-04 DIAGNOSIS — J3081 Allergic rhinitis due to animal (cat) (dog) hair and dander: Secondary | ICD-10-CM | POA: Diagnosis not present

## 2021-02-04 DIAGNOSIS — J3089 Other allergic rhinitis: Secondary | ICD-10-CM | POA: Diagnosis not present

## 2021-02-04 DIAGNOSIS — J301 Allergic rhinitis due to pollen: Secondary | ICD-10-CM | POA: Diagnosis not present

## 2021-02-25 DIAGNOSIS — F3342 Major depressive disorder, recurrent, in full remission: Secondary | ICD-10-CM | POA: Diagnosis not present

## 2021-02-25 DIAGNOSIS — K219 Gastro-esophageal reflux disease without esophagitis: Secondary | ICD-10-CM | POA: Diagnosis not present

## 2021-02-25 DIAGNOSIS — F324 Major depressive disorder, single episode, in partial remission: Secondary | ICD-10-CM | POA: Diagnosis not present

## 2021-02-25 DIAGNOSIS — I10 Essential (primary) hypertension: Secondary | ICD-10-CM | POA: Diagnosis not present

## 2021-02-25 DIAGNOSIS — E7800 Pure hypercholesterolemia, unspecified: Secondary | ICD-10-CM | POA: Diagnosis not present

## 2021-02-25 DIAGNOSIS — N183 Chronic kidney disease, stage 3 unspecified: Secondary | ICD-10-CM | POA: Diagnosis not present

## 2021-02-25 DIAGNOSIS — J4530 Mild persistent asthma, uncomplicated: Secondary | ICD-10-CM | POA: Diagnosis not present

## 2021-02-25 DIAGNOSIS — N1831 Chronic kidney disease, stage 3a: Secondary | ICD-10-CM | POA: Diagnosis not present

## 2021-03-12 DIAGNOSIS — J301 Allergic rhinitis due to pollen: Secondary | ICD-10-CM | POA: Diagnosis not present

## 2021-03-12 DIAGNOSIS — J3081 Allergic rhinitis due to animal (cat) (dog) hair and dander: Secondary | ICD-10-CM | POA: Diagnosis not present

## 2021-03-12 DIAGNOSIS — J3089 Other allergic rhinitis: Secondary | ICD-10-CM | POA: Diagnosis not present

## 2021-03-18 DIAGNOSIS — J301 Allergic rhinitis due to pollen: Secondary | ICD-10-CM | POA: Diagnosis not present

## 2021-03-18 DIAGNOSIS — J3081 Allergic rhinitis due to animal (cat) (dog) hair and dander: Secondary | ICD-10-CM | POA: Diagnosis not present

## 2021-03-18 DIAGNOSIS — J3089 Other allergic rhinitis: Secondary | ICD-10-CM | POA: Diagnosis not present

## 2021-03-25 DIAGNOSIS — J301 Allergic rhinitis due to pollen: Secondary | ICD-10-CM | POA: Diagnosis not present

## 2021-03-25 DIAGNOSIS — J3089 Other allergic rhinitis: Secondary | ICD-10-CM | POA: Diagnosis not present

## 2021-03-25 DIAGNOSIS — J3081 Allergic rhinitis due to animal (cat) (dog) hair and dander: Secondary | ICD-10-CM | POA: Diagnosis not present

## 2021-04-02 DIAGNOSIS — J3081 Allergic rhinitis due to animal (cat) (dog) hair and dander: Secondary | ICD-10-CM | POA: Diagnosis not present

## 2021-04-02 DIAGNOSIS — J301 Allergic rhinitis due to pollen: Secondary | ICD-10-CM | POA: Diagnosis not present

## 2021-04-02 DIAGNOSIS — J3089 Other allergic rhinitis: Secondary | ICD-10-CM | POA: Diagnosis not present

## 2021-04-30 DIAGNOSIS — J301 Allergic rhinitis due to pollen: Secondary | ICD-10-CM | POA: Diagnosis not present

## 2021-04-30 DIAGNOSIS — J3081 Allergic rhinitis due to animal (cat) (dog) hair and dander: Secondary | ICD-10-CM | POA: Diagnosis not present

## 2021-04-30 DIAGNOSIS — Z1231 Encounter for screening mammogram for malignant neoplasm of breast: Secondary | ICD-10-CM | POA: Diagnosis not present

## 2021-04-30 DIAGNOSIS — J3089 Other allergic rhinitis: Secondary | ICD-10-CM | POA: Diagnosis not present

## 2021-05-04 DIAGNOSIS — J3089 Other allergic rhinitis: Secondary | ICD-10-CM | POA: Diagnosis not present

## 2021-05-04 DIAGNOSIS — J3081 Allergic rhinitis due to animal (cat) (dog) hair and dander: Secondary | ICD-10-CM | POA: Diagnosis not present

## 2021-05-04 DIAGNOSIS — J301 Allergic rhinitis due to pollen: Secondary | ICD-10-CM | POA: Diagnosis not present

## 2021-05-13 DIAGNOSIS — J3089 Other allergic rhinitis: Secondary | ICD-10-CM | POA: Diagnosis not present

## 2021-05-13 DIAGNOSIS — J3081 Allergic rhinitis due to animal (cat) (dog) hair and dander: Secondary | ICD-10-CM | POA: Diagnosis not present

## 2021-05-13 DIAGNOSIS — J301 Allergic rhinitis due to pollen: Secondary | ICD-10-CM | POA: Diagnosis not present

## 2021-06-02 DIAGNOSIS — J301 Allergic rhinitis due to pollen: Secondary | ICD-10-CM | POA: Diagnosis not present

## 2021-06-02 DIAGNOSIS — J3089 Other allergic rhinitis: Secondary | ICD-10-CM | POA: Diagnosis not present

## 2021-06-02 DIAGNOSIS — J3081 Allergic rhinitis due to animal (cat) (dog) hair and dander: Secondary | ICD-10-CM | POA: Diagnosis not present

## 2021-06-17 DIAGNOSIS — U071 COVID-19: Secondary | ICD-10-CM | POA: Diagnosis not present

## 2021-06-29 DIAGNOSIS — N1831 Chronic kidney disease, stage 3a: Secondary | ICD-10-CM | POA: Diagnosis not present

## 2021-06-29 DIAGNOSIS — I10 Essential (primary) hypertension: Secondary | ICD-10-CM | POA: Diagnosis not present

## 2021-06-29 DIAGNOSIS — E7800 Pure hypercholesterolemia, unspecified: Secondary | ICD-10-CM | POA: Diagnosis not present

## 2021-06-29 DIAGNOSIS — E669 Obesity, unspecified: Secondary | ICD-10-CM | POA: Diagnosis not present

## 2021-06-29 DIAGNOSIS — M8580 Other specified disorders of bone density and structure, unspecified site: Secondary | ICD-10-CM | POA: Diagnosis not present

## 2021-06-29 DIAGNOSIS — J309 Allergic rhinitis, unspecified: Secondary | ICD-10-CM | POA: Diagnosis not present

## 2021-06-29 DIAGNOSIS — K219 Gastro-esophageal reflux disease without esophagitis: Secondary | ICD-10-CM | POA: Diagnosis not present

## 2021-06-29 DIAGNOSIS — F3342 Major depressive disorder, recurrent, in full remission: Secondary | ICD-10-CM | POA: Diagnosis not present

## 2021-06-29 DIAGNOSIS — J4530 Mild persistent asthma, uncomplicated: Secondary | ICD-10-CM | POA: Diagnosis not present

## 2021-07-02 DIAGNOSIS — F324 Major depressive disorder, single episode, in partial remission: Secondary | ICD-10-CM | POA: Diagnosis not present

## 2021-07-02 DIAGNOSIS — E7800 Pure hypercholesterolemia, unspecified: Secondary | ICD-10-CM | POA: Diagnosis not present

## 2021-07-02 DIAGNOSIS — K219 Gastro-esophageal reflux disease without esophagitis: Secondary | ICD-10-CM | POA: Diagnosis not present

## 2021-07-02 DIAGNOSIS — F3342 Major depressive disorder, recurrent, in full remission: Secondary | ICD-10-CM | POA: Diagnosis not present

## 2021-07-02 DIAGNOSIS — N1831 Chronic kidney disease, stage 3a: Secondary | ICD-10-CM | POA: Diagnosis not present

## 2021-07-02 DIAGNOSIS — I10 Essential (primary) hypertension: Secondary | ICD-10-CM | POA: Diagnosis not present

## 2021-07-02 DIAGNOSIS — J4530 Mild persistent asthma, uncomplicated: Secondary | ICD-10-CM | POA: Diagnosis not present

## 2021-07-16 DIAGNOSIS — J4520 Mild intermittent asthma, uncomplicated: Secondary | ICD-10-CM | POA: Diagnosis not present

## 2021-07-16 DIAGNOSIS — J3081 Allergic rhinitis due to animal (cat) (dog) hair and dander: Secondary | ICD-10-CM | POA: Diagnosis not present

## 2021-07-16 DIAGNOSIS — J301 Allergic rhinitis due to pollen: Secondary | ICD-10-CM | POA: Diagnosis not present

## 2021-07-16 DIAGNOSIS — J3089 Other allergic rhinitis: Secondary | ICD-10-CM | POA: Diagnosis not present

## 2021-07-20 DIAGNOSIS — J3081 Allergic rhinitis due to animal (cat) (dog) hair and dander: Secondary | ICD-10-CM | POA: Diagnosis not present

## 2021-07-20 DIAGNOSIS — J301 Allergic rhinitis due to pollen: Secondary | ICD-10-CM | POA: Diagnosis not present

## 2021-07-20 DIAGNOSIS — J3089 Other allergic rhinitis: Secondary | ICD-10-CM | POA: Diagnosis not present

## 2021-07-21 DIAGNOSIS — N1831 Chronic kidney disease, stage 3a: Secondary | ICD-10-CM | POA: Diagnosis not present

## 2021-07-21 DIAGNOSIS — J4530 Mild persistent asthma, uncomplicated: Secondary | ICD-10-CM | POA: Diagnosis not present

## 2021-07-21 DIAGNOSIS — I10 Essential (primary) hypertension: Secondary | ICD-10-CM | POA: Diagnosis not present

## 2021-07-21 DIAGNOSIS — K219 Gastro-esophageal reflux disease without esophagitis: Secondary | ICD-10-CM | POA: Diagnosis not present

## 2021-07-21 DIAGNOSIS — E7800 Pure hypercholesterolemia, unspecified: Secondary | ICD-10-CM | POA: Diagnosis not present

## 2021-07-21 DIAGNOSIS — F3342 Major depressive disorder, recurrent, in full remission: Secondary | ICD-10-CM | POA: Diagnosis not present

## 2021-07-23 DIAGNOSIS — J3089 Other allergic rhinitis: Secondary | ICD-10-CM | POA: Diagnosis not present

## 2021-07-23 DIAGNOSIS — J301 Allergic rhinitis due to pollen: Secondary | ICD-10-CM | POA: Diagnosis not present

## 2021-07-23 DIAGNOSIS — J3081 Allergic rhinitis due to animal (cat) (dog) hair and dander: Secondary | ICD-10-CM | POA: Diagnosis not present

## 2021-07-26 DIAGNOSIS — J3081 Allergic rhinitis due to animal (cat) (dog) hair and dander: Secondary | ICD-10-CM | POA: Diagnosis not present

## 2021-07-26 DIAGNOSIS — J3089 Other allergic rhinitis: Secondary | ICD-10-CM | POA: Diagnosis not present

## 2021-07-26 DIAGNOSIS — J301 Allergic rhinitis due to pollen: Secondary | ICD-10-CM | POA: Diagnosis not present

## 2021-07-29 DIAGNOSIS — J3089 Other allergic rhinitis: Secondary | ICD-10-CM | POA: Diagnosis not present

## 2021-07-29 DIAGNOSIS — Z23 Encounter for immunization: Secondary | ICD-10-CM | POA: Diagnosis not present

## 2021-07-29 DIAGNOSIS — J301 Allergic rhinitis due to pollen: Secondary | ICD-10-CM | POA: Diagnosis not present

## 2021-07-29 DIAGNOSIS — J3081 Allergic rhinitis due to animal (cat) (dog) hair and dander: Secondary | ICD-10-CM | POA: Diagnosis not present

## 2021-08-03 DIAGNOSIS — J3081 Allergic rhinitis due to animal (cat) (dog) hair and dander: Secondary | ICD-10-CM | POA: Diagnosis not present

## 2021-08-03 DIAGNOSIS — H35372 Puckering of macula, left eye: Secondary | ICD-10-CM | POA: Diagnosis not present

## 2021-08-03 DIAGNOSIS — J301 Allergic rhinitis due to pollen: Secondary | ICD-10-CM | POA: Diagnosis not present

## 2021-08-03 DIAGNOSIS — H40013 Open angle with borderline findings, low risk, bilateral: Secondary | ICD-10-CM | POA: Diagnosis not present

## 2021-08-03 DIAGNOSIS — J3089 Other allergic rhinitis: Secondary | ICD-10-CM | POA: Diagnosis not present

## 2021-08-16 DIAGNOSIS — J3089 Other allergic rhinitis: Secondary | ICD-10-CM | POA: Diagnosis not present

## 2021-08-16 DIAGNOSIS — J3081 Allergic rhinitis due to animal (cat) (dog) hair and dander: Secondary | ICD-10-CM | POA: Diagnosis not present

## 2021-08-16 DIAGNOSIS — J301 Allergic rhinitis due to pollen: Secondary | ICD-10-CM | POA: Diagnosis not present

## 2021-08-25 DIAGNOSIS — J3081 Allergic rhinitis due to animal (cat) (dog) hair and dander: Secondary | ICD-10-CM | POA: Diagnosis not present

## 2021-08-25 DIAGNOSIS — J3089 Other allergic rhinitis: Secondary | ICD-10-CM | POA: Diagnosis not present

## 2021-08-25 DIAGNOSIS — J301 Allergic rhinitis due to pollen: Secondary | ICD-10-CM | POA: Diagnosis not present

## 2021-08-31 DIAGNOSIS — J301 Allergic rhinitis due to pollen: Secondary | ICD-10-CM | POA: Diagnosis not present

## 2021-08-31 DIAGNOSIS — J3089 Other allergic rhinitis: Secondary | ICD-10-CM | POA: Diagnosis not present

## 2021-08-31 DIAGNOSIS — J3081 Allergic rhinitis due to animal (cat) (dog) hair and dander: Secondary | ICD-10-CM | POA: Diagnosis not present

## 2021-09-03 DIAGNOSIS — Z23 Encounter for immunization: Secondary | ICD-10-CM | POA: Diagnosis not present

## 2021-09-08 DIAGNOSIS — J3089 Other allergic rhinitis: Secondary | ICD-10-CM | POA: Diagnosis not present

## 2021-09-08 DIAGNOSIS — J301 Allergic rhinitis due to pollen: Secondary | ICD-10-CM | POA: Diagnosis not present

## 2021-09-08 DIAGNOSIS — J3081 Allergic rhinitis due to animal (cat) (dog) hair and dander: Secondary | ICD-10-CM | POA: Diagnosis not present

## 2021-09-16 DIAGNOSIS — J3081 Allergic rhinitis due to animal (cat) (dog) hair and dander: Secondary | ICD-10-CM | POA: Diagnosis not present

## 2021-09-16 DIAGNOSIS — J3089 Other allergic rhinitis: Secondary | ICD-10-CM | POA: Diagnosis not present

## 2021-09-16 DIAGNOSIS — J301 Allergic rhinitis due to pollen: Secondary | ICD-10-CM | POA: Diagnosis not present

## 2021-09-22 DIAGNOSIS — J3081 Allergic rhinitis due to animal (cat) (dog) hair and dander: Secondary | ICD-10-CM | POA: Diagnosis not present

## 2021-09-22 DIAGNOSIS — J301 Allergic rhinitis due to pollen: Secondary | ICD-10-CM | POA: Diagnosis not present

## 2021-09-22 DIAGNOSIS — J3089 Other allergic rhinitis: Secondary | ICD-10-CM | POA: Diagnosis not present

## 2021-10-20 DIAGNOSIS — J301 Allergic rhinitis due to pollen: Secondary | ICD-10-CM | POA: Diagnosis not present

## 2021-10-20 DIAGNOSIS — J3089 Other allergic rhinitis: Secondary | ICD-10-CM | POA: Diagnosis not present

## 2021-10-20 DIAGNOSIS — J3081 Allergic rhinitis due to animal (cat) (dog) hair and dander: Secondary | ICD-10-CM | POA: Diagnosis not present

## 2021-10-28 DIAGNOSIS — J3089 Other allergic rhinitis: Secondary | ICD-10-CM | POA: Diagnosis not present

## 2021-10-28 DIAGNOSIS — J3081 Allergic rhinitis due to animal (cat) (dog) hair and dander: Secondary | ICD-10-CM | POA: Diagnosis not present

## 2021-10-28 DIAGNOSIS — J301 Allergic rhinitis due to pollen: Secondary | ICD-10-CM | POA: Diagnosis not present

## 2021-11-08 ENCOUNTER — Ambulatory Visit (INDEPENDENT_AMBULATORY_CARE_PROVIDER_SITE_OTHER): Payer: Medicare Other | Admitting: Orthopaedic Surgery

## 2021-11-08 DIAGNOSIS — J3081 Allergic rhinitis due to animal (cat) (dog) hair and dander: Secondary | ICD-10-CM | POA: Diagnosis not present

## 2021-11-08 DIAGNOSIS — M25561 Pain in right knee: Secondary | ICD-10-CM | POA: Diagnosis not present

## 2021-11-08 DIAGNOSIS — M25542 Pain in joints of left hand: Secondary | ICD-10-CM | POA: Diagnosis not present

## 2021-11-08 DIAGNOSIS — G8929 Other chronic pain: Secondary | ICD-10-CM | POA: Diagnosis not present

## 2021-11-08 DIAGNOSIS — J301 Allergic rhinitis due to pollen: Secondary | ICD-10-CM | POA: Diagnosis not present

## 2021-11-08 DIAGNOSIS — M25562 Pain in left knee: Secondary | ICD-10-CM

## 2021-11-08 DIAGNOSIS — M25541 Pain in joints of right hand: Secondary | ICD-10-CM

## 2021-11-08 DIAGNOSIS — J3089 Other allergic rhinitis: Secondary | ICD-10-CM | POA: Diagnosis not present

## 2021-11-08 NOTE — Progress Notes (Signed)
Office Visit Note   Patient: [name]           Date of Birth: January 26, 1951           MRN: [id number] Visit Date: 11/08/2021              Requested by: Lois Huxley, [address],  [place] [address] PCP: Lois Huxley, PA   Assessment & Plan: Visit Diagnoses:  1. Joint pain in both hands   2. Chronic pain of right knee   3. Chronic pain of left knee     Plan: Based on her exam we did not need to x-ray anything today.  I did see hip x-rays showing both hips from 2018 which were normal.  I saw a left knee x-ray from 2019 which was normal.  I gave her reassurance that there is not anything from a surgical standpoint we recommend but certainly over-the-counter supplements such as turmeric or glucosamine would be worthwhile and Tylenol arthritis.  If she does have worsening pain at any point we can always see her back to consider steroid injections or even outpatient physical therapy.  All questions and concerns were answered and addressed.  Follow-Up Instructions: No follow-ups on file.   Orders:  No orders of the defined types were placed in this encounter.  No orders of the defined types were placed in this encounter.     Procedures: No procedures performed   Clinical Data: No additional findings.   Subjective: Chief Complaint  Patient presents with   Left Knee - Pain   Right Knee - Pain   Left Hip - Pain   Right Hip - Pain  The patient is a very active 71 year old female who comes in today to be established with an orthopedic doctor as it relates to chronic bilateral knee pain and bilateral hip pain as well as bilateral hand pain.  She has been told that she has arthritis and she wants to make sure that she has someone who she can see from time to time if things worsen.  Right now she is doing well.  She does have a history of a laminectomy of the lumbar spine and does get low back pain.  This was done by one of the neurosurgeons in town.   Sometime ago she did have physical therapy on her back for about 6 weeks and said that helped greatly after she had a flareup.  She reports hand pain after she has been cooking.  Her knees do grind on her.  She is never had any surgery on her hands or hips or knees.  She denies any symptoms of instability.  She does not walk with assistive device.  HPI  Review of Systems There is currently no headache, chest pain, shortness of breath, fever, chills, nausea, vomiting  Objective: Vital Signs: There were no vitals taken for this visit.  Physical Exam She is alert and orient x3 and in no acute distress Ortho Exam Examination of both knees shows no effusion.  Both knees are ligamentously stable with full range of motion.  There is some patellofemoral crepitation of both knees.  Both hips move smoothly and fluidly.  Of note both knees are nice and cool.  Both hands are normal on exam today. Specialty Comments:  No specialty comments available.  Imaging: No results found.   PMFS History: Patient Active Problem List   Diagnosis Date Noted   Chest pain of uncertain  etiology 03/05/2020   HTN (hypertension) 03/05/2020   Past Medical History:  Diagnosis Date   Allergic rhinitis    Asthma    Body mass index (BMI) of 30.0-30.9 in adult    Chest pain    CKD (chronic kidney disease), stage III    Depression    Estrogen deficiency    GERD (gastroesophageal reflux disease)    Hypercholesterolemia    Hypertension    Incisional hernia without obstruction or gangrene    Major depression single episode, in partial remission (HCC)    Major depressive disorder, recurrent episode, in full remission ([facility])    Mild persistent asthma without complication    Obesity    Other obesity due to excess calories    Palpitations    Reflux     Family History  Problem Relation Age of Onset   Hypertension Mother    Diabetes Mother     Past Surgical History:  Procedure Laterality Date   WRIST SURGERY  Left    Social History   Occupational History   Not on file  Tobacco Use   Smoking status: Unknown   Smokeless tobacco: Never  Substance and Sexual Activity   Alcohol use: Not on file   Drug use: Not on file   Sexual activity: Not on file

## 2021-11-16 DIAGNOSIS — J3081 Allergic rhinitis due to animal (cat) (dog) hair and dander: Secondary | ICD-10-CM | POA: Diagnosis not present

## 2021-11-16 DIAGNOSIS — J3089 Other allergic rhinitis: Secondary | ICD-10-CM | POA: Diagnosis not present

## 2021-11-16 DIAGNOSIS — J301 Allergic rhinitis due to pollen: Secondary | ICD-10-CM | POA: Diagnosis not present

## 2021-11-24 DIAGNOSIS — J301 Allergic rhinitis due to pollen: Secondary | ICD-10-CM | POA: Diagnosis not present

## 2021-11-24 DIAGNOSIS — J3089 Other allergic rhinitis: Secondary | ICD-10-CM | POA: Diagnosis not present

## 2021-11-24 DIAGNOSIS — J3081 Allergic rhinitis due to animal (cat) (dog) hair and dander: Secondary | ICD-10-CM | POA: Diagnosis not present

## 2021-12-07 DIAGNOSIS — J301 Allergic rhinitis due to pollen: Secondary | ICD-10-CM | POA: Diagnosis not present

## 2021-12-07 DIAGNOSIS — J3089 Other allergic rhinitis: Secondary | ICD-10-CM | POA: Diagnosis not present

## 2021-12-07 DIAGNOSIS — J3081 Allergic rhinitis due to animal (cat) (dog) hair and dander: Secondary | ICD-10-CM | POA: Diagnosis not present

## 2021-12-13 DIAGNOSIS — J3089 Other allergic rhinitis: Secondary | ICD-10-CM | POA: Diagnosis not present

## 2021-12-13 DIAGNOSIS — J3081 Allergic rhinitis due to animal (cat) (dog) hair and dander: Secondary | ICD-10-CM | POA: Diagnosis not present

## 2021-12-13 DIAGNOSIS — J301 Allergic rhinitis due to pollen: Secondary | ICD-10-CM | POA: Diagnosis not present

## 2021-12-20 DIAGNOSIS — J3081 Allergic rhinitis due to animal (cat) (dog) hair and dander: Secondary | ICD-10-CM | POA: Diagnosis not present

## 2021-12-20 DIAGNOSIS — J3089 Other allergic rhinitis: Secondary | ICD-10-CM | POA: Diagnosis not present

## 2021-12-20 DIAGNOSIS — J301 Allergic rhinitis due to pollen: Secondary | ICD-10-CM | POA: Diagnosis not present

## 2021-12-28 DIAGNOSIS — J301 Allergic rhinitis due to pollen: Secondary | ICD-10-CM | POA: Diagnosis not present

## 2021-12-28 DIAGNOSIS — J3089 Other allergic rhinitis: Secondary | ICD-10-CM | POA: Diagnosis not present

## 2021-12-28 DIAGNOSIS — J3081 Allergic rhinitis due to animal (cat) (dog) hair and dander: Secondary | ICD-10-CM | POA: Diagnosis not present

## 2022-01-04 DIAGNOSIS — J301 Allergic rhinitis due to pollen: Secondary | ICD-10-CM | POA: Diagnosis not present

## 2022-01-04 DIAGNOSIS — J3089 Other allergic rhinitis: Secondary | ICD-10-CM | POA: Diagnosis not present

## 2022-01-04 DIAGNOSIS — J3081 Allergic rhinitis due to animal (cat) (dog) hair and dander: Secondary | ICD-10-CM | POA: Diagnosis not present

## 2022-01-12 DIAGNOSIS — J3081 Allergic rhinitis due to animal (cat) (dog) hair and dander: Secondary | ICD-10-CM | POA: Diagnosis not present

## 2022-01-12 DIAGNOSIS — J3089 Other allergic rhinitis: Secondary | ICD-10-CM | POA: Diagnosis not present

## 2022-01-12 DIAGNOSIS — J301 Allergic rhinitis due to pollen: Secondary | ICD-10-CM | POA: Diagnosis not present

## 2022-01-18 DIAGNOSIS — J3081 Allergic rhinitis due to animal (cat) (dog) hair and dander: Secondary | ICD-10-CM | POA: Diagnosis not present

## 2022-01-18 DIAGNOSIS — J3089 Other allergic rhinitis: Secondary | ICD-10-CM | POA: Diagnosis not present

## 2022-01-18 DIAGNOSIS — J301 Allergic rhinitis due to pollen: Secondary | ICD-10-CM | POA: Diagnosis not present

## 2022-01-19 DIAGNOSIS — J4530 Mild persistent asthma, uncomplicated: Secondary | ICD-10-CM | POA: Diagnosis not present

## 2022-01-19 DIAGNOSIS — E7800 Pure hypercholesterolemia, unspecified: Secondary | ICD-10-CM | POA: Diagnosis not present

## 2022-01-19 DIAGNOSIS — J309 Allergic rhinitis, unspecified: Secondary | ICD-10-CM | POA: Diagnosis not present

## 2022-01-19 DIAGNOSIS — Z1389 Encounter for screening for other disorder: Secondary | ICD-10-CM | POA: Diagnosis not present

## 2022-01-19 DIAGNOSIS — N1831 Chronic kidney disease, stage 3a: Secondary | ICD-10-CM | POA: Diagnosis not present

## 2022-01-19 DIAGNOSIS — Z0000 Encounter for general adult medical examination without abnormal findings: Secondary | ICD-10-CM | POA: Diagnosis not present

## 2022-01-19 DIAGNOSIS — E669 Obesity, unspecified: Secondary | ICD-10-CM | POA: Diagnosis not present

## 2022-01-19 DIAGNOSIS — M8580 Other specified disorders of bone density and structure, unspecified site: Secondary | ICD-10-CM | POA: Diagnosis not present

## 2022-01-19 DIAGNOSIS — F3342 Major depressive disorder, recurrent, in full remission: Secondary | ICD-10-CM | POA: Diagnosis not present

## 2022-01-19 DIAGNOSIS — I10 Essential (primary) hypertension: Secondary | ICD-10-CM | POA: Diagnosis not present

## 2022-01-19 DIAGNOSIS — K219 Gastro-esophageal reflux disease without esophagitis: Secondary | ICD-10-CM | POA: Diagnosis not present

## 2022-02-01 DIAGNOSIS — J3089 Other allergic rhinitis: Secondary | ICD-10-CM | POA: Diagnosis not present

## 2022-02-01 DIAGNOSIS — J3081 Allergic rhinitis due to animal (cat) (dog) hair and dander: Secondary | ICD-10-CM | POA: Diagnosis not present

## 2022-02-01 DIAGNOSIS — J301 Allergic rhinitis due to pollen: Secondary | ICD-10-CM | POA: Diagnosis not present

## 2022-02-04 DIAGNOSIS — Z23 Encounter for immunization: Secondary | ICD-10-CM | POA: Diagnosis not present

## 2022-02-10 DIAGNOSIS — J301 Allergic rhinitis due to pollen: Secondary | ICD-10-CM | POA: Diagnosis not present

## 2022-02-10 DIAGNOSIS — J3089 Other allergic rhinitis: Secondary | ICD-10-CM | POA: Diagnosis not present

## 2022-02-10 DIAGNOSIS — J3081 Allergic rhinitis due to animal (cat) (dog) hair and dander: Secondary | ICD-10-CM | POA: Diagnosis not present

## 2022-02-18 DIAGNOSIS — J3089 Other allergic rhinitis: Secondary | ICD-10-CM | POA: Diagnosis not present

## 2022-02-18 DIAGNOSIS — J3081 Allergic rhinitis due to animal (cat) (dog) hair and dander: Secondary | ICD-10-CM | POA: Diagnosis not present

## 2022-02-18 DIAGNOSIS — J301 Allergic rhinitis due to pollen: Secondary | ICD-10-CM | POA: Diagnosis not present

## 2022-02-24 DIAGNOSIS — J3089 Other allergic rhinitis: Secondary | ICD-10-CM | POA: Diagnosis not present

## 2022-02-24 DIAGNOSIS — J301 Allergic rhinitis due to pollen: Secondary | ICD-10-CM | POA: Diagnosis not present

## 2022-02-24 DIAGNOSIS — J3081 Allergic rhinitis due to animal (cat) (dog) hair and dander: Secondary | ICD-10-CM | POA: Diagnosis not present

## 2022-03-10 DIAGNOSIS — J3081 Allergic rhinitis due to animal (cat) (dog) hair and dander: Secondary | ICD-10-CM | POA: Diagnosis not present

## 2022-03-10 DIAGNOSIS — J301 Allergic rhinitis due to pollen: Secondary | ICD-10-CM | POA: Diagnosis not present

## 2022-03-10 DIAGNOSIS — J3089 Other allergic rhinitis: Secondary | ICD-10-CM | POA: Diagnosis not present

## 2022-03-17 ENCOUNTER — Ambulatory Visit (INDEPENDENT_AMBULATORY_CARE_PROVIDER_SITE_OTHER): Payer: Medicare Other | Admitting: Podiatry

## 2022-03-17 ENCOUNTER — Ambulatory Visit (INDEPENDENT_AMBULATORY_CARE_PROVIDER_SITE_OTHER): Payer: Medicare Other

## 2022-03-17 DIAGNOSIS — M79671 Pain in right foot: Secondary | ICD-10-CM

## 2022-03-17 DIAGNOSIS — M7741 Metatarsalgia, right foot: Secondary | ICD-10-CM

## 2022-03-17 DIAGNOSIS — M2041 Other hammer toe(s) (acquired), right foot: Secondary | ICD-10-CM | POA: Diagnosis not present

## 2022-03-17 NOTE — Patient Instructions (Signed)
Hammer Toe Hammer toe is a change in the shape, or a deformity, of the toe. The deformity causes the middle joint of the toe to stay bent. Hammer toe starts gradually. At first, the toe can be straightened. Then over time, the toe deformity becomes stiff, inflexible, and permanently bent. Hammer toe usually affects the second, third, or fourth toe. A hammer toe causes pain, especially when wearing shoes. Corns and calluses can result from the toe rubbing against the inside of the shoe. Early treatments to keep the toe straight may relieve pain. As the deformity of the toe becomes stiff and permanent, surgery may be needed to straighten the toe. What are the causes? This condition is caused by abnormal bending of the toe joint that is closest to your foot. Over time, the toe bending downward pulls on the muscles and connections (tendons) of the toe joint, making them weak and stiff. Wearing shoes that are too narrow in the toe box and do not allow toes to fully straighten can cause this condition. What increases the risk? You are more likely to develop this condition if you: Are an older female. Wear shoes that are too small, or wear high-heeled shoes that pinch your toes. Have a second toe that is longer than your big toe (first toe). Injure your foot or toe. Have arthritis, or have a nerve or muscle disorder. Have diabetes or a condition known as Charcot joint, which may cause you to walk abnormally. Have a family history of hammer toe. Are a ballet dancer. What are the signs or symptoms? Pain and deformity of the toe are the main symptoms of this condition. The pain is worse when wearing shoes, walking, or running. Other symptoms may include: A thickened patch of skin, called a corn or callus, that forms over the top of the bent part of the toe or between the toes. Redness and a burning feeling on the bent toe. An open sore that forms on the top of the bent toe. Not being able to straighten  the affected toe. How is this diagnosed? This condition is diagnosed based on your symptoms and a physical exam. During the exam, your health care provider will try to straighten your toe to see how stiff the deformity is. You may also have tests, such as: A blood test to check for rheumatoid arthritis or diabetes. An X-ray to show how severe the toe deformity is. How is this treated? Treatment for this condition depends on whether the toe is flexible or deformed and no longer moveable. In less severe cases, a hammer toe can be straightened without surgery. These treatments include: Taping the toe into a straightened position. Using pads and cushions to protect the bent toe. Wearing shoes that provide enough room for the toes. Doing toe-stretching exercises at home. Taking an NSAID, such as ibuprofen, to reduce pain and swelling. Using special orthotics or insoles for pain relief and to improve walking. If these treatments do not help or the toe has a severe deformity and cannot be straightened, surgery is the next option. The most common surgeries used to straighten a hammer toe include: Arthroplasty or osteotomy. Part of the toe joint is reconstructed or removed, which allows the toe to straighten. Fusion. Cartilage between the two bones of the joint is taken out, and the bones are fused together into one longer bone. Implantation. Part of the bone is removed and replaced with an implant to allow the toe to move again. Flexor tendon transfer.   The tendons that curl the toes down (flexor tendons) are repositioned. Follow these instructions at home: Take over-the-counter and prescription medicines only as told by your health care provider. Do toe-straightening and stretching exercises as told by your health care provider. Keep all follow-up visits. This is important. How is this prevented? Wear shoes that fit properly and give your toes enough room. Shoes should not cause pain. Buy shoes at  the end of the day to make sure they fit well, since your foot may swell during the day. Make sure they are comfortable before you buy them. As you age, your shoe size might change, including the width. Measure both feet and buy shoes for the larger foot. A shoe repair store might be able to stretch shoes that feel tight in spots. Do not wear high-heeled shoes or shoes with pointed toes. Contact a health care provider if: Your pain gets worse. Your toe becomes red or swollen. You develop an open sore on your toe. Summary Hammer toe is a condition that gradually causes your toe to become bent and stiff. Hammer toe can be treated by taping the toe into a straightened position and doing toe-stretching exercises. If these treatments do not help, surgery may be needed. To prevent this condition, wear shoes that fit properly, give your toes enough room, and do not cause pain. This information is not intended to replace advice given to you by your health care provider. Make sure you discuss any questions you have with your health care provider. Document Revised: 01/02/2020 Document Reviewed: 01/02/2020 Elsevier Patient Education  2023 [employer]  

## 2022-03-18 DIAGNOSIS — J301 Allergic rhinitis due to pollen: Secondary | ICD-10-CM | POA: Diagnosis not present

## 2022-03-18 DIAGNOSIS — J3089 Other allergic rhinitis: Secondary | ICD-10-CM | POA: Diagnosis not present

## 2022-03-18 DIAGNOSIS — J3081 Allergic rhinitis due to animal (cat) (dog) hair and dander: Secondary | ICD-10-CM | POA: Diagnosis not present

## 2022-03-20 NOTE — Progress Notes (Signed)
Subjective: 71 year old female presents the office today with concerns pain along the right second toe.  She states the pain feels different than it did previously, saw her back in 2021.  She uses a toe crest which does help some.  No recent injury or change otherwise.   Objective: AAO x3, NAD DP/PT pulses palpable bilaterally, CRT less than 3 seconds Hammertoe present along the right second digit and this is where she has discomfort mostly in the PIPJ but she does get some discomfort in the MPJ.  There is no sign of edema.  No palpable neuroma.  Trace edema on the MPJ plantarly.  MMT 5/5. No pain with calf compression, swelling, warmth, erythema  Assessment: 71 year old female with hammertoe, capsulitis right foot  Plan: -All treatment options discussed with the patient including all alternatives, risks, complications.  -X-rays were obtained reviewed.  3 views of the right foot were obtained.  No evidence of acute fracture.  Hammertoe present. -She continues toe crest.  I also added a metatarsal pad to help offload and help decrease the pressure off the hammertoe.  Discussed shoe modifications to avoid any excess pressure.  Discussed about toe exercises as well. -Patient encouraged to call the office with any questions, concerns, change in symptoms.   Trula Slade DPM

## 2022-03-25 DIAGNOSIS — J3089 Other allergic rhinitis: Secondary | ICD-10-CM | POA: Diagnosis not present

## 2022-03-25 DIAGNOSIS — J3081 Allergic rhinitis due to animal (cat) (dog) hair and dander: Secondary | ICD-10-CM | POA: Diagnosis not present

## 2022-03-25 DIAGNOSIS — J301 Allergic rhinitis due to pollen: Secondary | ICD-10-CM | POA: Diagnosis not present

## 2022-04-07 DIAGNOSIS — J301 Allergic rhinitis due to pollen: Secondary | ICD-10-CM | POA: Diagnosis not present

## 2022-04-07 DIAGNOSIS — J3089 Other allergic rhinitis: Secondary | ICD-10-CM | POA: Diagnosis not present

## 2022-04-07 DIAGNOSIS — J3081 Allergic rhinitis due to animal (cat) (dog) hair and dander: Secondary | ICD-10-CM | POA: Diagnosis not present

## 2022-04-13 DIAGNOSIS — J3089 Other allergic rhinitis: Secondary | ICD-10-CM | POA: Diagnosis not present

## 2022-04-13 DIAGNOSIS — J301 Allergic rhinitis due to pollen: Secondary | ICD-10-CM | POA: Diagnosis not present

## 2022-04-13 DIAGNOSIS — J3081 Allergic rhinitis due to animal (cat) (dog) hair and dander: Secondary | ICD-10-CM | POA: Diagnosis not present

## 2022-04-21 DIAGNOSIS — J301 Allergic rhinitis due to pollen: Secondary | ICD-10-CM | POA: Diagnosis not present

## 2022-04-21 DIAGNOSIS — J3081 Allergic rhinitis due to animal (cat) (dog) hair and dander: Secondary | ICD-10-CM | POA: Diagnosis not present

## 2022-04-21 DIAGNOSIS — J3089 Other allergic rhinitis: Secondary | ICD-10-CM | POA: Diagnosis not present

## 2022-04-28 ENCOUNTER — Ambulatory Visit: Payer: Medicare Other | Admitting: Podiatry

## 2022-05-03 DIAGNOSIS — J301 Allergic rhinitis due to pollen: Secondary | ICD-10-CM | POA: Diagnosis not present

## 2022-05-03 DIAGNOSIS — J3089 Other allergic rhinitis: Secondary | ICD-10-CM | POA: Diagnosis not present

## 2022-05-03 DIAGNOSIS — J3081 Allergic rhinitis due to animal (cat) (dog) hair and dander: Secondary | ICD-10-CM | POA: Diagnosis not present

## 2022-05-06 DIAGNOSIS — Z1231 Encounter for screening mammogram for malignant neoplasm of breast: Secondary | ICD-10-CM | POA: Diagnosis not present

## 2022-05-13 DIAGNOSIS — J3089 Other allergic rhinitis: Secondary | ICD-10-CM | POA: Diagnosis not present

## 2022-05-13 DIAGNOSIS — J3081 Allergic rhinitis due to animal (cat) (dog) hair and dander: Secondary | ICD-10-CM | POA: Diagnosis not present

## 2022-05-13 DIAGNOSIS — J301 Allergic rhinitis due to pollen: Secondary | ICD-10-CM | POA: Diagnosis not present

## 2022-05-20 DIAGNOSIS — J301 Allergic rhinitis due to pollen: Secondary | ICD-10-CM | POA: Diagnosis not present

## 2022-05-20 DIAGNOSIS — J3089 Other allergic rhinitis: Secondary | ICD-10-CM | POA: Diagnosis not present

## 2022-05-20 DIAGNOSIS — J3081 Allergic rhinitis due to animal (cat) (dog) hair and dander: Secondary | ICD-10-CM | POA: Diagnosis not present

## 2022-05-27 DIAGNOSIS — J3081 Allergic rhinitis due to animal (cat) (dog) hair and dander: Secondary | ICD-10-CM | POA: Diagnosis not present

## 2022-05-27 DIAGNOSIS — J301 Allergic rhinitis due to pollen: Secondary | ICD-10-CM | POA: Diagnosis not present

## 2022-05-27 DIAGNOSIS — J3089 Other allergic rhinitis: Secondary | ICD-10-CM | POA: Diagnosis not present

## 2022-06-07 DIAGNOSIS — J3081 Allergic rhinitis due to animal (cat) (dog) hair and dander: Secondary | ICD-10-CM | POA: Diagnosis not present

## 2022-06-07 DIAGNOSIS — J301 Allergic rhinitis due to pollen: Secondary | ICD-10-CM | POA: Diagnosis not present

## 2022-06-07 DIAGNOSIS — J3089 Other allergic rhinitis: Secondary | ICD-10-CM | POA: Diagnosis not present

## 2022-06-16 DIAGNOSIS — J301 Allergic rhinitis due to pollen: Secondary | ICD-10-CM | POA: Diagnosis not present

## 2022-06-16 DIAGNOSIS — J3081 Allergic rhinitis due to animal (cat) (dog) hair and dander: Secondary | ICD-10-CM | POA: Diagnosis not present

## 2022-06-16 DIAGNOSIS — J3089 Other allergic rhinitis: Secondary | ICD-10-CM | POA: Diagnosis not present

## 2022-06-22 DIAGNOSIS — J301 Allergic rhinitis due to pollen: Secondary | ICD-10-CM | POA: Diagnosis not present

## 2022-06-22 DIAGNOSIS — J3089 Other allergic rhinitis: Secondary | ICD-10-CM | POA: Diagnosis not present

## 2022-06-22 DIAGNOSIS — J3081 Allergic rhinitis due to animal (cat) (dog) hair and dander: Secondary | ICD-10-CM | POA: Diagnosis not present

## 2022-06-24 DIAGNOSIS — J3089 Other allergic rhinitis: Secondary | ICD-10-CM | POA: Diagnosis not present

## 2022-06-24 DIAGNOSIS — J3081 Allergic rhinitis due to animal (cat) (dog) hair and dander: Secondary | ICD-10-CM | POA: Diagnosis not present

## 2022-06-24 DIAGNOSIS — J301 Allergic rhinitis due to pollen: Secondary | ICD-10-CM | POA: Diagnosis not present

## 2022-07-01 DIAGNOSIS — J3081 Allergic rhinitis due to animal (cat) (dog) hair and dander: Secondary | ICD-10-CM | POA: Diagnosis not present

## 2022-07-01 DIAGNOSIS — J3089 Other allergic rhinitis: Secondary | ICD-10-CM | POA: Diagnosis not present

## 2022-07-01 DIAGNOSIS — J301 Allergic rhinitis due to pollen: Secondary | ICD-10-CM | POA: Diagnosis not present

## 2022-07-14 DIAGNOSIS — J3089 Other allergic rhinitis: Secondary | ICD-10-CM | POA: Diagnosis not present

## 2022-07-14 DIAGNOSIS — J301 Allergic rhinitis due to pollen: Secondary | ICD-10-CM | POA: Diagnosis not present

## 2022-07-14 DIAGNOSIS — Z23 Encounter for immunization: Secondary | ICD-10-CM | POA: Diagnosis not present

## 2022-07-14 DIAGNOSIS — J3081 Allergic rhinitis due to animal (cat) (dog) hair and dander: Secondary | ICD-10-CM | POA: Diagnosis not present

## 2022-07-26 DIAGNOSIS — Z23 Encounter for immunization: Secondary | ICD-10-CM | POA: Diagnosis not present

## 2022-07-27 DIAGNOSIS — J4530 Mild persistent asthma, uncomplicated: Secondary | ICD-10-CM | POA: Diagnosis not present

## 2022-07-27 DIAGNOSIS — E669 Obesity, unspecified: Secondary | ICD-10-CM | POA: Diagnosis not present

## 2022-07-27 DIAGNOSIS — I10 Essential (primary) hypertension: Secondary | ICD-10-CM | POA: Diagnosis not present

## 2022-07-27 DIAGNOSIS — K219 Gastro-esophageal reflux disease without esophagitis: Secondary | ICD-10-CM | POA: Diagnosis not present

## 2022-07-27 DIAGNOSIS — M8580 Other specified disorders of bone density and structure, unspecified site: Secondary | ICD-10-CM | POA: Diagnosis not present

## 2022-07-27 DIAGNOSIS — E7800 Pure hypercholesterolemia, unspecified: Secondary | ICD-10-CM | POA: Diagnosis not present

## 2022-07-27 DIAGNOSIS — N1831 Chronic kidney disease, stage 3a: Secondary | ICD-10-CM | POA: Diagnosis not present

## 2022-07-27 DIAGNOSIS — J309 Allergic rhinitis, unspecified: Secondary | ICD-10-CM | POA: Diagnosis not present

## 2022-07-27 DIAGNOSIS — F3342 Major depressive disorder, recurrent, in full remission: Secondary | ICD-10-CM | POA: Diagnosis not present

## 2022-08-04 DIAGNOSIS — J3081 Allergic rhinitis due to animal (cat) (dog) hair and dander: Secondary | ICD-10-CM | POA: Diagnosis not present

## 2022-08-04 DIAGNOSIS — J301 Allergic rhinitis due to pollen: Secondary | ICD-10-CM | POA: Diagnosis not present

## 2022-08-04 DIAGNOSIS — J3089 Other allergic rhinitis: Secondary | ICD-10-CM | POA: Diagnosis not present

## 2022-08-08 DIAGNOSIS — H35372 Puckering of macula, left eye: Secondary | ICD-10-CM | POA: Diagnosis not present

## 2022-08-08 DIAGNOSIS — H40013 Open angle with borderline findings, low risk, bilateral: Secondary | ICD-10-CM | POA: Diagnosis not present

## 2022-08-11 DIAGNOSIS — J3089 Other allergic rhinitis: Secondary | ICD-10-CM | POA: Diagnosis not present

## 2022-08-11 DIAGNOSIS — J3081 Allergic rhinitis due to animal (cat) (dog) hair and dander: Secondary | ICD-10-CM | POA: Diagnosis not present

## 2022-08-11 DIAGNOSIS — J301 Allergic rhinitis due to pollen: Secondary | ICD-10-CM | POA: Diagnosis not present

## 2022-08-22 DIAGNOSIS — J3089 Other allergic rhinitis: Secondary | ICD-10-CM | POA: Diagnosis not present

## 2022-08-22 DIAGNOSIS — J3081 Allergic rhinitis due to animal (cat) (dog) hair and dander: Secondary | ICD-10-CM | POA: Diagnosis not present

## 2022-08-22 DIAGNOSIS — J301 Allergic rhinitis due to pollen: Secondary | ICD-10-CM | POA: Diagnosis not present

## 2022-08-29 DIAGNOSIS — J3089 Other allergic rhinitis: Secondary | ICD-10-CM | POA: Diagnosis not present

## 2022-08-29 DIAGNOSIS — J301 Allergic rhinitis due to pollen: Secondary | ICD-10-CM | POA: Diagnosis not present

## 2022-08-29 DIAGNOSIS — J3081 Allergic rhinitis due to animal (cat) (dog) hair and dander: Secondary | ICD-10-CM | POA: Diagnosis not present

## 2022-09-05 DIAGNOSIS — J301 Allergic rhinitis due to pollen: Secondary | ICD-10-CM | POA: Diagnosis not present

## 2022-09-05 DIAGNOSIS — J3089 Other allergic rhinitis: Secondary | ICD-10-CM | POA: Diagnosis not present

## 2022-09-05 DIAGNOSIS — J3081 Allergic rhinitis due to animal (cat) (dog) hair and dander: Secondary | ICD-10-CM | POA: Diagnosis not present

## 2022-09-13 DIAGNOSIS — J3089 Other allergic rhinitis: Secondary | ICD-10-CM | POA: Diagnosis not present

## 2022-09-13 DIAGNOSIS — J3081 Allergic rhinitis due to animal (cat) (dog) hair and dander: Secondary | ICD-10-CM | POA: Diagnosis not present

## 2022-09-13 DIAGNOSIS — J4520 Mild intermittent asthma, uncomplicated: Secondary | ICD-10-CM | POA: Diagnosis not present

## 2022-09-13 DIAGNOSIS — J301 Allergic rhinitis due to pollen: Secondary | ICD-10-CM | POA: Diagnosis not present

## 2022-09-21 DIAGNOSIS — J301 Allergic rhinitis due to pollen: Secondary | ICD-10-CM | POA: Diagnosis not present

## 2022-09-21 DIAGNOSIS — J3081 Allergic rhinitis due to animal (cat) (dog) hair and dander: Secondary | ICD-10-CM | POA: Diagnosis not present

## 2022-09-21 DIAGNOSIS — J3089 Other allergic rhinitis: Secondary | ICD-10-CM | POA: Diagnosis not present

## 2022-09-29 DIAGNOSIS — J3089 Other allergic rhinitis: Secondary | ICD-10-CM | POA: Diagnosis not present

## 2022-09-29 DIAGNOSIS — J301 Allergic rhinitis due to pollen: Secondary | ICD-10-CM | POA: Diagnosis not present

## 2022-09-29 DIAGNOSIS — J3081 Allergic rhinitis due to animal (cat) (dog) hair and dander: Secondary | ICD-10-CM | POA: Diagnosis not present

## 2022-10-06 DIAGNOSIS — J3081 Allergic rhinitis due to animal (cat) (dog) hair and dander: Secondary | ICD-10-CM | POA: Diagnosis not present

## 2022-10-06 DIAGNOSIS — J301 Allergic rhinitis due to pollen: Secondary | ICD-10-CM | POA: Diagnosis not present

## 2022-10-06 DIAGNOSIS — J3089 Other allergic rhinitis: Secondary | ICD-10-CM | POA: Diagnosis not present

## 2022-10-20 DIAGNOSIS — J3089 Other allergic rhinitis: Secondary | ICD-10-CM | POA: Diagnosis not present

## 2022-10-20 DIAGNOSIS — J3081 Allergic rhinitis due to animal (cat) (dog) hair and dander: Secondary | ICD-10-CM | POA: Diagnosis not present

## 2022-10-20 DIAGNOSIS — J301 Allergic rhinitis due to pollen: Secondary | ICD-10-CM | POA: Diagnosis not present

## 2022-11-02 DIAGNOSIS — J301 Allergic rhinitis due to pollen: Secondary | ICD-10-CM | POA: Diagnosis not present

## 2022-11-02 DIAGNOSIS — J3081 Allergic rhinitis due to animal (cat) (dog) hair and dander: Secondary | ICD-10-CM | POA: Diagnosis not present

## 2022-11-02 DIAGNOSIS — J3089 Other allergic rhinitis: Secondary | ICD-10-CM | POA: Diagnosis not present

## 2023-03-15 DIAGNOSIS — J301 Allergic rhinitis due to pollen: Secondary | ICD-10-CM | POA: Diagnosis not present

## 2023-03-15 DIAGNOSIS — J4520 Mild intermittent asthma, uncomplicated: Secondary | ICD-10-CM | POA: Diagnosis not present

## 2023-03-15 DIAGNOSIS — J3081 Allergic rhinitis due to animal (cat) (dog) hair and dander: Secondary | ICD-10-CM | POA: Diagnosis not present

## 2023-03-15 DIAGNOSIS — J3089 Other allergic rhinitis: Secondary | ICD-10-CM | POA: Diagnosis not present

## 2023-03-29 DIAGNOSIS — J301 Allergic rhinitis due to pollen: Secondary | ICD-10-CM | POA: Diagnosis not present

## 2023-03-29 DIAGNOSIS — J3081 Allergic rhinitis due to animal (cat) (dog) hair and dander: Secondary | ICD-10-CM | POA: Diagnosis not present

## 2023-03-29 DIAGNOSIS — J3089 Other allergic rhinitis: Secondary | ICD-10-CM | POA: Diagnosis not present

## 2023-04-10 DIAGNOSIS — J3081 Allergic rhinitis due to animal (cat) (dog) hair and dander: Secondary | ICD-10-CM | POA: Diagnosis not present

## 2023-04-10 DIAGNOSIS — J301 Allergic rhinitis due to pollen: Secondary | ICD-10-CM | POA: Diagnosis not present

## 2023-04-10 DIAGNOSIS — J3089 Other allergic rhinitis: Secondary | ICD-10-CM | POA: Diagnosis not present

## 2023-04-17 DIAGNOSIS — J3089 Other allergic rhinitis: Secondary | ICD-10-CM | POA: Diagnosis not present

## 2023-04-17 DIAGNOSIS — J3081 Allergic rhinitis due to animal (cat) (dog) hair and dander: Secondary | ICD-10-CM | POA: Diagnosis not present

## 2023-04-17 DIAGNOSIS — J301 Allergic rhinitis due to pollen: Secondary | ICD-10-CM | POA: Diagnosis not present

## 2023-04-24 DIAGNOSIS — J301 Allergic rhinitis due to pollen: Secondary | ICD-10-CM | POA: Diagnosis not present

## 2023-04-24 DIAGNOSIS — J3089 Other allergic rhinitis: Secondary | ICD-10-CM | POA: Diagnosis not present

## 2023-04-24 DIAGNOSIS — J3081 Allergic rhinitis due to animal (cat) (dog) hair and dander: Secondary | ICD-10-CM | POA: Diagnosis not present

## 2023-05-01 DIAGNOSIS — J301 Allergic rhinitis due to pollen: Secondary | ICD-10-CM | POA: Diagnosis not present

## 2023-05-01 DIAGNOSIS — J3089 Other allergic rhinitis: Secondary | ICD-10-CM | POA: Diagnosis not present

## 2023-05-01 DIAGNOSIS — J3081 Allergic rhinitis due to animal (cat) (dog) hair and dander: Secondary | ICD-10-CM | POA: Diagnosis not present

## 2023-05-08 DIAGNOSIS — J301 Allergic rhinitis due to pollen: Secondary | ICD-10-CM | POA: Diagnosis not present

## 2023-05-08 DIAGNOSIS — J3081 Allergic rhinitis due to animal (cat) (dog) hair and dander: Secondary | ICD-10-CM | POA: Diagnosis not present

## 2023-05-08 DIAGNOSIS — J3089 Other allergic rhinitis: Secondary | ICD-10-CM | POA: Diagnosis not present

## 2023-05-19 DIAGNOSIS — Z1231 Encounter for screening mammogram for malignant neoplasm of breast: Secondary | ICD-10-CM | POA: Diagnosis not present

## 2023-05-22 DIAGNOSIS — J3089 Other allergic rhinitis: Secondary | ICD-10-CM | POA: Diagnosis not present

## 2023-05-22 DIAGNOSIS — J3081 Allergic rhinitis due to animal (cat) (dog) hair and dander: Secondary | ICD-10-CM | POA: Diagnosis not present

## 2023-05-22 DIAGNOSIS — J301 Allergic rhinitis due to pollen: Secondary | ICD-10-CM | POA: Diagnosis not present

## 2023-05-29 DIAGNOSIS — J301 Allergic rhinitis due to pollen: Secondary | ICD-10-CM | POA: Diagnosis not present

## 2023-05-29 DIAGNOSIS — J3089 Other allergic rhinitis: Secondary | ICD-10-CM | POA: Diagnosis not present

## 2023-05-29 DIAGNOSIS — J3081 Allergic rhinitis due to animal (cat) (dog) hair and dander: Secondary | ICD-10-CM | POA: Diagnosis not present

## 2023-06-05 DIAGNOSIS — J301 Allergic rhinitis due to pollen: Secondary | ICD-10-CM | POA: Diagnosis not present

## 2023-06-05 DIAGNOSIS — J3089 Other allergic rhinitis: Secondary | ICD-10-CM | POA: Diagnosis not present

## 2023-06-05 DIAGNOSIS — J3081 Allergic rhinitis due to animal (cat) (dog) hair and dander: Secondary | ICD-10-CM | POA: Diagnosis not present

## 2023-06-13 DIAGNOSIS — J301 Allergic rhinitis due to pollen: Secondary | ICD-10-CM | POA: Diagnosis not present

## 2023-06-13 DIAGNOSIS — J3089 Other allergic rhinitis: Secondary | ICD-10-CM | POA: Diagnosis not present

## 2023-06-13 DIAGNOSIS — J3081 Allergic rhinitis due to animal (cat) (dog) hair and dander: Secondary | ICD-10-CM | POA: Diagnosis not present

## 2023-06-19 DIAGNOSIS — J3081 Allergic rhinitis due to animal (cat) (dog) hair and dander: Secondary | ICD-10-CM | POA: Diagnosis not present

## 2023-06-19 DIAGNOSIS — J301 Allergic rhinitis due to pollen: Secondary | ICD-10-CM | POA: Diagnosis not present

## 2023-06-19 DIAGNOSIS — J3089 Other allergic rhinitis: Secondary | ICD-10-CM | POA: Diagnosis not present

## 2023-06-26 DIAGNOSIS — J3089 Other allergic rhinitis: Secondary | ICD-10-CM | POA: Diagnosis not present

## 2023-06-26 DIAGNOSIS — J3081 Allergic rhinitis due to animal (cat) (dog) hair and dander: Secondary | ICD-10-CM | POA: Diagnosis not present

## 2023-06-26 DIAGNOSIS — J301 Allergic rhinitis due to pollen: Secondary | ICD-10-CM | POA: Diagnosis not present

## 2023-06-30 DIAGNOSIS — Z23 Encounter for immunization: Secondary | ICD-10-CM | POA: Diagnosis not present

## 2023-07-05 DIAGNOSIS — J301 Allergic rhinitis due to pollen: Secondary | ICD-10-CM | POA: Diagnosis not present

## 2023-07-05 DIAGNOSIS — J3081 Allergic rhinitis due to animal (cat) (dog) hair and dander: Secondary | ICD-10-CM | POA: Diagnosis not present

## 2023-07-05 DIAGNOSIS — J3089 Other allergic rhinitis: Secondary | ICD-10-CM | POA: Diagnosis not present

## 2023-07-10 DIAGNOSIS — J301 Allergic rhinitis due to pollen: Secondary | ICD-10-CM | POA: Diagnosis not present

## 2023-07-10 DIAGNOSIS — J3089 Other allergic rhinitis: Secondary | ICD-10-CM | POA: Diagnosis not present

## 2023-07-10 DIAGNOSIS — J3081 Allergic rhinitis due to animal (cat) (dog) hair and dander: Secondary | ICD-10-CM | POA: Diagnosis not present

## 2023-07-17 DIAGNOSIS — J3089 Other allergic rhinitis: Secondary | ICD-10-CM | POA: Diagnosis not present

## 2023-07-17 DIAGNOSIS — J3081 Allergic rhinitis due to animal (cat) (dog) hair and dander: Secondary | ICD-10-CM | POA: Diagnosis not present

## 2023-07-17 DIAGNOSIS — J301 Allergic rhinitis due to pollen: Secondary | ICD-10-CM | POA: Diagnosis not present

## 2023-07-24 DIAGNOSIS — J3089 Other allergic rhinitis: Secondary | ICD-10-CM | POA: Diagnosis not present

## 2023-07-24 DIAGNOSIS — J301 Allergic rhinitis due to pollen: Secondary | ICD-10-CM | POA: Diagnosis not present

## 2023-07-24 DIAGNOSIS — J3081 Allergic rhinitis due to animal (cat) (dog) hair and dander: Secondary | ICD-10-CM | POA: Diagnosis not present

## 2023-08-01 DIAGNOSIS — J3081 Allergic rhinitis due to animal (cat) (dog) hair and dander: Secondary | ICD-10-CM | POA: Diagnosis not present

## 2023-08-01 DIAGNOSIS — J301 Allergic rhinitis due to pollen: Secondary | ICD-10-CM | POA: Diagnosis not present

## 2023-08-01 DIAGNOSIS — J3089 Other allergic rhinitis: Secondary | ICD-10-CM | POA: Diagnosis not present

## 2023-08-14 DIAGNOSIS — J301 Allergic rhinitis due to pollen: Secondary | ICD-10-CM | POA: Diagnosis not present

## 2023-08-14 DIAGNOSIS — J3081 Allergic rhinitis due to animal (cat) (dog) hair and dander: Secondary | ICD-10-CM | POA: Diagnosis not present

## 2023-08-14 DIAGNOSIS — J3089 Other allergic rhinitis: Secondary | ICD-10-CM | POA: Diagnosis not present

## 2023-08-21 DIAGNOSIS — J3081 Allergic rhinitis due to animal (cat) (dog) hair and dander: Secondary | ICD-10-CM | POA: Diagnosis not present

## 2023-08-21 DIAGNOSIS — J3089 Other allergic rhinitis: Secondary | ICD-10-CM | POA: Diagnosis not present

## 2023-08-21 DIAGNOSIS — J301 Allergic rhinitis due to pollen: Secondary | ICD-10-CM | POA: Diagnosis not present

## 2023-08-28 DIAGNOSIS — J3081 Allergic rhinitis due to animal (cat) (dog) hair and dander: Secondary | ICD-10-CM | POA: Diagnosis not present

## 2023-08-28 DIAGNOSIS — J3089 Other allergic rhinitis: Secondary | ICD-10-CM | POA: Diagnosis not present

## 2023-08-28 DIAGNOSIS — J301 Allergic rhinitis due to pollen: Secondary | ICD-10-CM | POA: Diagnosis not present

## 2023-09-04 DIAGNOSIS — J3089 Other allergic rhinitis: Secondary | ICD-10-CM | POA: Diagnosis not present

## 2023-09-04 DIAGNOSIS — J301 Allergic rhinitis due to pollen: Secondary | ICD-10-CM | POA: Diagnosis not present

## 2023-09-04 DIAGNOSIS — J3081 Allergic rhinitis due to animal (cat) (dog) hair and dander: Secondary | ICD-10-CM | POA: Diagnosis not present

## 2023-09-11 DIAGNOSIS — J3089 Other allergic rhinitis: Secondary | ICD-10-CM | POA: Diagnosis not present

## 2023-09-11 DIAGNOSIS — J3081 Allergic rhinitis due to animal (cat) (dog) hair and dander: Secondary | ICD-10-CM | POA: Diagnosis not present

## 2023-09-11 DIAGNOSIS — J301 Allergic rhinitis due to pollen: Secondary | ICD-10-CM | POA: Diagnosis not present

## 2023-09-18 DIAGNOSIS — J3081 Allergic rhinitis due to animal (cat) (dog) hair and dander: Secondary | ICD-10-CM | POA: Diagnosis not present

## 2023-09-18 DIAGNOSIS — J301 Allergic rhinitis due to pollen: Secondary | ICD-10-CM | POA: Diagnosis not present

## 2023-09-18 DIAGNOSIS — J3089 Other allergic rhinitis: Secondary | ICD-10-CM | POA: Diagnosis not present

## 2023-09-21 DIAGNOSIS — M9903 Segmental and somatic dysfunction of lumbar region: Secondary | ICD-10-CM | POA: Diagnosis not present

## 2023-09-21 DIAGNOSIS — M9902 Segmental and somatic dysfunction of thoracic region: Secondary | ICD-10-CM | POA: Diagnosis not present

## 2023-09-21 DIAGNOSIS — M9905 Segmental and somatic dysfunction of pelvic region: Secondary | ICD-10-CM | POA: Diagnosis not present

## 2023-09-21 DIAGNOSIS — M9901 Segmental and somatic dysfunction of cervical region: Secondary | ICD-10-CM | POA: Diagnosis not present

## 2023-09-26 DIAGNOSIS — M9902 Segmental and somatic dysfunction of thoracic region: Secondary | ICD-10-CM | POA: Diagnosis not present

## 2023-09-26 DIAGNOSIS — M9901 Segmental and somatic dysfunction of cervical region: Secondary | ICD-10-CM | POA: Diagnosis not present

## 2023-09-26 DIAGNOSIS — J3081 Allergic rhinitis due to animal (cat) (dog) hair and dander: Secondary | ICD-10-CM | POA: Diagnosis not present

## 2023-09-26 DIAGNOSIS — M9905 Segmental and somatic dysfunction of pelvic region: Secondary | ICD-10-CM | POA: Diagnosis not present

## 2023-09-26 DIAGNOSIS — J301 Allergic rhinitis due to pollen: Secondary | ICD-10-CM | POA: Diagnosis not present

## 2023-09-26 DIAGNOSIS — J3089 Other allergic rhinitis: Secondary | ICD-10-CM | POA: Diagnosis not present

## 2023-09-26 DIAGNOSIS — M9903 Segmental and somatic dysfunction of lumbar region: Secondary | ICD-10-CM | POA: Diagnosis not present

## 2023-09-28 DIAGNOSIS — M9903 Segmental and somatic dysfunction of lumbar region: Secondary | ICD-10-CM | POA: Diagnosis not present

## 2023-09-28 DIAGNOSIS — M9905 Segmental and somatic dysfunction of pelvic region: Secondary | ICD-10-CM | POA: Diagnosis not present

## 2023-09-28 DIAGNOSIS — M9901 Segmental and somatic dysfunction of cervical region: Secondary | ICD-10-CM | POA: Diagnosis not present

## 2023-09-28 DIAGNOSIS — M9902 Segmental and somatic dysfunction of thoracic region: Secondary | ICD-10-CM | POA: Diagnosis not present

## 2023-10-02 DIAGNOSIS — M9903 Segmental and somatic dysfunction of lumbar region: Secondary | ICD-10-CM | POA: Diagnosis not present

## 2023-10-02 DIAGNOSIS — M9905 Segmental and somatic dysfunction of pelvic region: Secondary | ICD-10-CM | POA: Diagnosis not present

## 2023-10-02 DIAGNOSIS — J3089 Other allergic rhinitis: Secondary | ICD-10-CM | POA: Diagnosis not present

## 2023-10-02 DIAGNOSIS — J301 Allergic rhinitis due to pollen: Secondary | ICD-10-CM | POA: Diagnosis not present

## 2023-10-02 DIAGNOSIS — M9902 Segmental and somatic dysfunction of thoracic region: Secondary | ICD-10-CM | POA: Diagnosis not present

## 2023-10-02 DIAGNOSIS — J3081 Allergic rhinitis due to animal (cat) (dog) hair and dander: Secondary | ICD-10-CM | POA: Diagnosis not present

## 2023-10-02 DIAGNOSIS — M9901 Segmental and somatic dysfunction of cervical region: Secondary | ICD-10-CM | POA: Diagnosis not present

## 2023-10-09 DIAGNOSIS — J3081 Allergic rhinitis due to animal (cat) (dog) hair and dander: Secondary | ICD-10-CM | POA: Diagnosis not present

## 2023-10-09 DIAGNOSIS — J301 Allergic rhinitis due to pollen: Secondary | ICD-10-CM | POA: Diagnosis not present

## 2023-10-09 DIAGNOSIS — J3089 Other allergic rhinitis: Secondary | ICD-10-CM | POA: Diagnosis not present

## 2023-10-10 DIAGNOSIS — M9903 Segmental and somatic dysfunction of lumbar region: Secondary | ICD-10-CM | POA: Diagnosis not present

## 2023-10-10 DIAGNOSIS — M9905 Segmental and somatic dysfunction of pelvic region: Secondary | ICD-10-CM | POA: Diagnosis not present

## 2023-10-10 DIAGNOSIS — M9901 Segmental and somatic dysfunction of cervical region: Secondary | ICD-10-CM | POA: Diagnosis not present

## 2023-10-10 DIAGNOSIS — M9902 Segmental and somatic dysfunction of thoracic region: Secondary | ICD-10-CM | POA: Diagnosis not present

## 2024-01-06 ENCOUNTER — Ambulatory Visit (HOSPITAL_COMMUNITY): Admission: EM | Admit: 2024-01-06 | Discharge: 2024-01-06 | Disposition: A | Discharge status: Home / Self Care

## 2024-01-06 ENCOUNTER — Encounter (HOSPITAL_COMMUNITY): Payer: Self-pay | Admitting: Emergency Medicine

## 2024-01-06 DIAGNOSIS — K047 Periapical abscess without sinus: Secondary | ICD-10-CM | POA: Diagnosis not present

## 2024-01-06 MED ORDER — AMOXICILLIN-POT CLAVULANATE 875-125 MG PO TABS: 1.0000 | ORAL_TABLET | Freq: Two times a day (BID) | ORAL | 0 refills | Status: DC

## 2024-01-06 MED ORDER — IBUPROFEN 600 MG PO TABS: 600.0000 mg | ORAL_TABLET | Freq: Four times a day (QID) | ORAL | 0 refills | Status: DC | PRN

## 2024-01-06 NOTE — ED Triage Notes (Signed)
 Pt reports "sinuses have been acting up for couple weeks", bad taste in mouth and "smell something rotten", pt reports that she has a bad upper right posterior tooth that is hurting.  Used Afrin intermittent, saline spray, and allergy medications from doctor (ie Flonase and others).  Took dual pain-Tylenol and advil for dental pain.

## 2024-01-06 NOTE — Discharge Instructions (Addendum)
 1. Dental infection (Primary) - ibuprofen (ADVIL) 600 MG tablet; Take 1 tablet (600 mg total) by mouth every 6 (six) hours as needed.  Dispense: 30 tablet; Refill: 0 - amoxicillin-clavulanate (AUGMENTIN) 875-125 MG tablet; Take 1 tablet by mouth every 12 (twelve) hours.  Dispense: 14 tablet; Refill: 0 -Follow-up with dentist as scheduled for tooth extraction to prevent further infection in the future.

## 2024-01-06 NOTE — ED Provider Notes (Signed)
 [facility]  Note:  This document was prepared using Dragon voice recognition software and may include unintentional dictation errors.  MRN: [id number] DOB: 20-May-1951  Subjective:   [name] is a 73 y.o. female presenting for right upper dental pain and possible sinus congestion and inflammation.  Patient reports that she has had dental pain for about 2 to 3 weeks and is also having nasal congestion most likely from allergies but she was concerned she might be developing a sinus infection.  Patient has been taking Tylenol/ibuprofen dual relief with minimal improvement to symptoms.  Patient has planned tooth extraction in approximately 10 days with her dentist but was concerned she may have a dental abscess or sinus infection.  No fever, shortness of breath, chest pain, weakness, dizziness, purulent discharge from mouth or sinuses.  No current facility-administered medications for this encounter.  Current Outpatient Medications:    amoxicillin-clavulanate (AUGMENTIN) 875-125 MG tablet, Take 1 tablet by mouth every 12 (twelve) hours., Disp: 14 tablet, Rfl: 0   ibuprofen (ADVIL) 600 MG tablet, Take 1 tablet (600 mg total) by mouth every 6 (six) hours as needed., Disp: 30 tablet, Rfl: 0   TRELEGY ELLIPTA 200-62.5-25 MCG/ACT AEPB, Take 1 puff by mouth daily., Disp: , Rfl:    albuterol (VENTOLIN HFA) 108 (90 Base) MCG/ACT inhaler, Inhale into the lungs every 6 (six) hours as needed for wheezing or shortness of breath., Disp: , Rfl:    b complex vitamins tablet, Take 1 tablet by mouth daily., Disp: , Rfl:    BREO ELLIPTA 200-25 MCG/INH AEPB, Inhale 1 puff into the lungs daily., Disp: , Rfl:    buPROPion (WELLBUTRIN XL) 150 MG 24 hr tablet, Take 150 mg by mouth every morning., Disp: , Rfl:    Calcium Carb-Cholecalciferol (CALCIUM + D3 PO), Take by mouth., Disp: , Rfl:    EPINEPHrine 0.3 mg/0.3 mL IJ SOAJ injection, Inject 0.3 mg into the muscle as needed., Disp: , Rfl:     famotidine (PEPCID) 40 MG tablet, Take 40 mg by mouth 2 (two) times daily., Disp: , Rfl:    fluticasone (FLONASE) 50 MCG/ACT nasal spray, Place into both nostrils daily., Disp: , Rfl:    levocetirizine (XYZAL) 5 MG tablet, Take 5 mg by mouth daily., Disp: , Rfl:    losartan (COZAAR) 100 MG tablet, Take 100 mg by mouth daily., Disp: , Rfl:    montelukast (SINGULAIR) 10 MG tablet, Take 10 mg by mouth daily., Disp: , Rfl:    Allergies  Allergen Reactions   Tramadol Other (See Comments)    dizzy   Tramadol Other (See Comments)    dizziness    Past Medical History:  Diagnosis Date   Allergic rhinitis    Asthma    Body mass index (BMI) of 30.0-30.9 in adult    Chest pain    CKD (chronic kidney disease), stage III (HCC)    Depression    Estrogen deficiency    GERD (gastroesophageal reflux disease)    Hypercholesterolemia    Hypertension    Incisional hernia without obstruction or gangrene    Major depression single episode, in partial remission (HCC)    Major depressive disorder, recurrent episode, in full remission (HCC)    Mild persistent asthma without complication    Obesity    Other obesity due to excess calories    Palpitations    Reflux      Past Surgical History:  Procedure Laterality Date   WRIST SURGERY Left  Family History  Problem Relation Age of Onset   Hypertension Mother    Diabetes Mother     Social History   Tobacco Use   Smoking status: Unknown   Smokeless tobacco: Never    ROS Refer to HPI for ROS details.  Objective:   Vitals: BP (!) 162/98 (BP Location: Right Arm)   Pulse 78   Temp 98.6 F (37 C) (Oral)   Resp 17   SpO2 97%   Physical Exam Vitals and nursing note reviewed.  Constitutional:      General: She is not in acute distress.    Appearance: Normal appearance. She is well-developed. She is not ill-appearing or toxic-appearing.  HENT:     Head: Normocephalic.     Nose: Congestion and rhinorrhea present.     Right Sinus:  No maxillary sinus tenderness or frontal sinus tenderness.     Left Sinus: No maxillary sinus tenderness or frontal sinus tenderness.     Mouth/Throat:     Lips: Pink. No lesions.     Mouth: Mucous membranes are moist.     Dentition: Abnormal dentition. Dental tenderness, gingival swelling and dental caries present. No dental abscesses.  Cardiovascular:     Rate and Rhythm: Normal rate.  Pulmonary:     Effort: Pulmonary effort is normal. No respiratory distress.  Skin:    General: Skin is warm and dry.  Neurological:     General: No focal deficit present.     Mental Status: She is alert and oriented to person, place, and time.  Psychiatric:        Mood and Affect: Mood normal.        Behavior: Behavior normal.     Procedures  No results found for this or any previous visit (from the past 24 hours).  Assessment and Plan :   PDMP not reviewed this encounter.  1. Dental infection    1. Dental infection (Primary) - ibuprofen (ADVIL) 600 MG tablet; Take 1 tablet (600 mg total) by mouth every 6 (six) hours as needed.  Dispense: 30 tablet; Refill: 0 - amoxicillin-clavulanate (AUGMENTIN) 875-125 MG tablet; Take 1 tablet by mouth every 12 (twelve) hours.  Dispense: 14 tablet; Refill: 0 -Follow-up with dentist as scheduled for tooth extraction to prevent further infection in the future. -Continue to monitor symptoms for any change in severity if there is any escalation of current symptoms or development of new symptoms follow-up in ER for further evaluation and management.  Lucky Cowboy   [place], [place], [place] 01/06/24 1651

## 2024-07-10 ENCOUNTER — Other Ambulatory Visit: Payer: Self-pay | Admitting: Medical Genetics

## 2024-07-16 ENCOUNTER — Encounter: Payer: Self-pay | Admitting: Sports Medicine

## 2024-07-16 ENCOUNTER — Ambulatory Visit (INDEPENDENT_AMBULATORY_CARE_PROVIDER_SITE_OTHER): Admitting: Sports Medicine

## 2024-07-16 VITALS — BP 140/90 | HR 64 | Temp 96.4°F | Resp 16 | Ht 64.0 in | Wt 158.6 lb

## 2024-07-16 DIAGNOSIS — M545 Low back pain, unspecified: Secondary | ICD-10-CM

## 2024-07-16 DIAGNOSIS — I10 Essential (primary) hypertension: Secondary | ICD-10-CM | POA: Diagnosis not present

## 2024-07-16 DIAGNOSIS — K219 Gastro-esophageal reflux disease without esophagitis: Secondary | ICD-10-CM

## 2024-07-16 DIAGNOSIS — K5900 Constipation, unspecified: Secondary | ICD-10-CM | POA: Diagnosis not present

## 2024-07-16 DIAGNOSIS — N189 Chronic kidney disease, unspecified: Secondary | ICD-10-CM | POA: Diagnosis not present

## 2024-07-16 DIAGNOSIS — E538 Deficiency of other specified B group vitamins: Secondary | ICD-10-CM

## 2024-07-16 DIAGNOSIS — R5383 Other fatigue: Secondary | ICD-10-CM

## 2024-07-16 DIAGNOSIS — E782 Mixed hyperlipidemia: Secondary | ICD-10-CM

## 2024-07-16 DIAGNOSIS — Z1231 Encounter for screening mammogram for malignant neoplasm of breast: Secondary | ICD-10-CM

## 2024-07-16 DIAGNOSIS — G8929 Other chronic pain: Secondary | ICD-10-CM

## 2024-07-16 DIAGNOSIS — Z780 Asymptomatic menopausal state: Secondary | ICD-10-CM

## 2024-07-16 MED ORDER — LOSARTAN POTASSIUM 25 MG PO TABS: 25.0000 mg | ORAL_TABLET | Freq: Every day | ORAL | 1 refills | Status: AC

## 2024-07-16 MED ORDER — FAMOTIDINE 40 MG PO TABS: 40.0000 mg | ORAL_TABLET | Freq: Every day | ORAL | 2 refills | Status: AC

## 2024-07-16 NOTE — Progress Notes (Signed)
 Careteam: Patient Care Team: Sherlynn Madden, MD as PCP - General (Internal Medicine) Nahser, Aleene PARAS, MD (Inactive) as PCP - Cardiology (Cardiology) Brien Charleston, MD (Family Medicine)  PLACE OF SERVICE:  [facility]  Advanced Directive information    Allergies  Allergen Reactions   Tramadol Other (See Comments)    dizzy   Tramadol Other (See Comments)    dizziness    Chief Complaint  Patient presents with   Establish Care    New patient.     Discussed the use of AI scribe software for clinical note transcription with the patient, who gave verbal consent to proceed.  History of Present Illness  [name] is a 73 year old female who presents for a new patient visit    She has mild asthma, managed with daily Singulair and an inhaler, with no recent exacerbations in the past six months. She experiences occasional cough and shortness of breath, attributing these to environmental allergies. She previously managed allergies with immunotherapy but discontinued due to perceived ineffectiveness.  She has a history of hypertension but stopped taking her medication two to three years ago due to dissatisfaction with her previous care provider. She attempts to manage her blood pressure through exercise and diet but notes home readings often between 135-140/85-90 mmHg. She checks her blood pressure infrequently.  She experiences intermittent stomach pain, distinct from gut pain, associated with ibuprofen  use, which she takes for pain relief as acetaminophen  is ineffective. The pain is sharp and raw, lasting 45 minutes to an hour. She reports that eating something mild sometimes makes it better. She also reports easy bruising and constipation, which she believes may be related to ibuprofen  use. No symptoms of acid reflux, nausea, or vomiting associated with her stomach pain.  She has a history of an incisional hernia from a previous laparoscopic cholecystectomy, which has  worsened over the years. She reports a concern about her kidney function, previously noted by her former care provider, though she does not recall specific details about her kidney function tests.  She describes a fall approximately two and a half months ago, resulting in lower back pain and discomfort in her knee. The back pain is described as an ache that worsens throughout the day, sometimes extending across the glutes, with a pain level of 5-6/10 on average. No urinary or fecal incontinence, tingling, or numbness in her feet. She has reduced her walking from two miles to 20 minutes due to discomfort.  She reports that her cholesterol has previously been high on occasion, but not enough to require medication. No history of myocardial infarction, cerebrovascular accident, or transient ischemic attack. She quit smoking in 1978 and drinks approximately 64 ounces of fluids daily, including non-water beverages.  She reports recent hot flashes and temperature intolerance, feeling cold at 65 degrees and hot at 80 degrees. She had a colonoscopy five years ago, which was normal except for hemorrhoids, and reports daily bowel movements with no hematochezia or melena. She had a bone density test two to three years ago and a mammogram last year, with another due this year.    Review of Systems:  Review of Systems  Constitutional:  Negative for chills and fever.  HENT:  Negative for congestion and sore throat.   Respiratory:  Negative for cough, sputum production and shortness of breath.   Cardiovascular:  Negative for chest pain, palpitations and leg swelling.  Gastrointestinal:  Positive for constipation and heartburn. Negative for abdominal pain and nausea.  Genitourinary:  Negative for dysuria, frequency and hematuria.  Musculoskeletal:  Positive for back pain and joint pain. Negative for falls and myalgias.  Neurological:  Negative for dizziness.   Negative unless indicated in HPI.   Past Medical  History:  Diagnosis Date   Allergic rhinitis    Asthma    Body mass index (BMI) of 30.0-30.9 in adult    Chest pain    CKD (chronic kidney disease), stage III (HCC)    Depression    Estrogen deficiency    GERD (gastroesophageal reflux disease)    Hypercholesterolemia    Hypertension    Incisional hernia without obstruction or gangrene    Major depression single episode, in partial remission    Major depressive disorder, recurrent episode, in full remission    Mild persistent asthma without complication    Obesity    Other obesity due to excess calories    Palpitations    Reflux    Past Surgical History:  Procedure Laterality Date   WRIST SURGERY Left    Social History:   reports that she has an unknown smoking status. She has never used smokeless tobacco. No history on file for alcohol use and drug use.  Family History  Problem Relation Age of Onset   Hypertension Mother    Diabetes Mother     Medications: Patient's Medications  New Prescriptions   No medications on file  Previous Medications   ALBUTEROL (VENTOLIN HFA) 108 (90 BASE) MCG/ACT INHALER    Inhale into the lungs every 6 (six) hours as needed for wheezing or shortness of breath.   AMOXICILLIN -CLAVULANATE (AUGMENTIN ) 875-125 MG TABLET    Take 1 tablet by mouth every 12 (twelve) hours.   B COMPLEX VITAMINS TABLET    Take 1 tablet by mouth daily.   BREO ELLIPTA 200-25 MCG/INH AEPB    Inhale 1 puff into the lungs daily.   BUPROPION (WELLBUTRIN XL) 150 MG 24 HR TABLET    Take 150 mg by mouth every morning.   CALCIUM CARB-CHOLECALCIFEROL (CALCIUM + D3 PO)    Take by mouth.   EPINEPHRINE 0.3 MG/0.3 ML IJ SOAJ INJECTION    Inject 0.3 mg into the muscle as needed.   FAMOTIDINE (PEPCID) 40 MG TABLET    Take 40 mg by mouth 2 (two) times daily.   FLUTICASONE (FLONASE) 50 MCG/ACT NASAL SPRAY    Place into both nostrils daily.   IBUPROFEN  (ADVIL ) 600 MG TABLET    Take 1 tablet (600 mg total) by mouth every 6 (six) hours as  needed.   LEVOCETIRIZINE (XYZAL) 5 MG TABLET    Take 5 mg by mouth daily.   LOSARTAN (COZAAR) 100 MG TABLET    Take 100 mg by mouth daily.   MONTELUKAST (SINGULAIR) 10 MG TABLET    Take 10 mg by mouth daily.   TRELEGY ELLIPTA 200-62.5-25 MCG/ACT AEPB    Take 1 puff by mouth daily.  Modified Medications   No medications on file  Discontinued Medications   No medications on file    Physical Exam: There were no vitals filed for this visit. There is no height or weight on file to calculate BMI. BP Readings from Last 3 Encounters:  01/06/24 (!) 162/98  03/05/20 116/90  12/26/16 (!) 174/91   Wt Readings from Last 3 Encounters:  03/05/20 170 lb 4 oz (77.2 kg)  12/26/16 145 lb (65.8 kg)  03/06/14 182 lb (82.6 kg)    Physical Exam Constitutional:  Appearance: Normal appearance.  HENT:     Head: Normocephalic and atraumatic.  Cardiovascular:     Rate and Rhythm: Normal rate and regular rhythm.  Pulmonary:     Effort: Pulmonary effort is normal. No respiratory distress.     Breath sounds: Normal breath sounds. No wheezing.  Abdominal:     General: Bowel sounds are normal. There is no distension.     Tenderness: There is no abdominal tenderness. There is no guarding or rebound.     Comments:    Musculoskeletal:     Comments: Left knee- no redness, mild joint line tenderness Rom intact   Neurological:     Mental Status: She is alert. Mental status is at baseline.     Motor: No weakness.     Labs reviewed: Basic Metabolic Panel: No results for input(s): NA, K, CL, CO2, GLUCOSE, BUN, CREATININE, CALCIUM, MG, PHOS, TSH in the last 8760 hours. Liver Function Tests: No results for input(s): AST, ALT, ALKPHOS, BILITOT, PROT, ALBUMIN in the last 8760 hours. No results for input(s): LIPASE, AMYLASE in the last 8760 hours. No results for input(s): AMMONIA in the last 8760 hours. CBC: No results for input(s): WBC, NEUTROABS, HGB,  HCT, MCV, PLT in the last 8760 hours. Lipid Panel: No results for input(s): CHOL, HDL, LDLCALC, TRIG, CHOLHDL, LDLDIRECT in the last 8760 hours. TSH: No results for input(s): TSH in the last 8760 hours. A1C: No results found for: HGBA1C  Assessment and Plan Assessment & Plan  1. Primary hypertension (Primary)  Will start low dose losartan Avoid salty foods Exercise regularly  Monitor bp daily - losartan (COZAAR) 25 MG tablet; Take 1 tablet (25 mg total) by mouth daily.  Dispense: 90 tablet; Refill: 1  2. Chronic kidney disease, unspecified CKD stage  Avoid nephrotoxic meds Stop nsaids Increase oral hydration - CBC (no diff) - Complete Metabolic Panel with eGFR - Vitamin D, 25-hydroxy  3. Constipation, unspecified constipation type  Increase oral hydration and fiber intake Take colace daily  4. Chronic bilateral low back pain without sciatica  No red flag signs Take tylenol  prn Stop nsaids Cont with physical therapy Use heating pads and lidocaine patches  5. Gastroesophageal reflux disease, unspecified whether esophagitis present  Denies dark or bloody stools Take pepcid - famotidine (PEPCID) 40 MG tablet; Take 1 tablet (40 mg total) by mouth daily.  Dispense: 120 tablet; Refill: 2  6. B12 deficiency   - Vitamin B12  7. Mixed hyperlipidemia   - Lipid Panel  8. Other fatigue   - TSH  9. Postmenopausal estrogen deficiency   - DG Bone Density; Future - MM 3D SCREENING MAMMOGRAM BILATERAL BREAST; Future  10. Breast cancer screening by mammogram    Other orders - Misc Natural Products (OSTEO BI-FLEX JOINT SHIELD PO); Take 1 tablet by mouth in the morning and at bedtime. - CALCIUM MAGNESIUM ZINC PO; Take 1 tablet by mouth 3 (three) times daily. - Multiple Vitamins-Minerals (WOMENS 50+ MULTI VITAMIN PO); Take 1 tablet by mouth daily.

## 2024-07-23 ENCOUNTER — Other Ambulatory Visit

## 2024-07-23 ENCOUNTER — Telehealth: Payer: Self-pay | Admitting: Sports Medicine

## 2024-07-23 MED ORDER — BUPROPION HCL ER (XL) 150 MG PO TB24: 150.0000 mg | ORAL_TABLET | ORAL | 3 refills | Status: DC | PRN

## 2024-07-23 NOTE — Telephone Encounter (Signed)
 Left a detail voicemail in regards to medication refill to confirm the pharmacy and the quantity of the does and refills to RX request.  Medication is pending to complete the order for the patient

## 2024-07-23 NOTE — Telephone Encounter (Signed)
 The patient was in the office for labs and requested a refill on buPROPion (WELLBUTRIN XL) 150 MG 24 hr tablet. Stated this has not been filled by Dr. Sherlynn, as she just established care. Please advise requesting it go to the pharmacy below.  [id number] - [place], [place] - [address] AT [facility] [address]

## 2024-07-23 NOTE — Telephone Encounter (Signed)
 Patient return my phone call and let her know that we send the medication to her pharmacy. Patient had no questions or concerns at this time.

## 2024-07-24 ENCOUNTER — Ambulatory Visit: Payer: Self-pay | Admitting: Sports Medicine

## 2024-07-24 LAB — CBC
HCT: 42.1 % (ref 35.0–45.0)
Hemoglobin: 13.8 g/dL (ref 11.7–15.5)
MCH: 29.1 pg (ref 27.0–33.0)
MCHC: 32.8 g/dL (ref 32.0–36.0)
MCV: 88.8 fL (ref 80.0–100.0)
MPV: 9.9 fL (ref 7.5–12.5)
Platelets: 276 Thousand/uL (ref 140–400)
RBC: 4.74 Million/uL (ref 3.80–5.10)
RDW: 13.6 % (ref 11.0–15.0)
WBC: 5.6 Thousand/uL (ref 3.8–10.8)

## 2024-07-24 LAB — COMPLETE METABOLIC PANEL WITHOUT GFR
AG Ratio: 2.1 (calc) (ref 1.0–2.5)
ALT: 18 U/L (ref 6–29)
AST: 19 U/L (ref 10–35)
Albumin: 4 g/dL (ref 3.6–5.1)
Alkaline phosphatase (APISO): 74 U/L (ref 37–153)
BUN: 10 mg/dL (ref 7–25)
CO2: 27 mmol/L (ref 20–32)
Calcium: 9.5 mg/dL (ref 8.6–10.4)
Chloride: 101 mmol/L (ref 98–110)
Creat: 0.82 mg/dL (ref 0.60–1.00)
Globulin: 1.9 g/dL (ref 1.9–3.7)
Glucose, Bld: 87 mg/dL (ref 65–99)
Potassium: 4.4 mmol/L (ref 3.5–5.3)
Sodium: 136 mmol/L (ref 135–146)
Total Bilirubin: 0.6 mg/dL (ref 0.2–1.2)
Total Protein: 5.9 g/dL — ABNORMAL LOW (ref 6.1–8.1)

## 2024-07-24 LAB — VITAMIN B12: Vitamin B-12: 815 pg/mL (ref 200–1100)

## 2024-07-24 LAB — VITAMIN D 25 HYDROXY (VIT D DEFICIENCY, FRACTURES): Vit D, 25-Hydroxy: 58 ng/mL (ref 30–100)

## 2024-07-24 LAB — TSH: TSH: 4.39 m[IU]/L (ref 0.40–4.50)

## 2024-07-24 LAB — LIPID PANEL
Cholesterol: 199 mg/dL (ref <200)
HDL: 87 mg/dL (ref >=50)
LDL Cholesterol (Calc): 92 mg/dL
Non-HDL Cholesterol (Calc): 112 mg/dL (ref <130)
Total CHOL/HDL Ratio: 2.3 (calc) (ref <5.0)
Triglycerides: 103 mg/dL (ref <150)

## 2024-08-13 ENCOUNTER — Other Ambulatory Visit: Payer: Self-pay | Admitting: Medical

## 2024-08-13 ENCOUNTER — Other Ambulatory Visit: Payer: Self-pay

## 2024-08-13 DIAGNOSIS — M25562 Pain in left knee: Secondary | ICD-10-CM

## 2024-08-13 DIAGNOSIS — Z006 Encounter for examination for normal comparison and control in clinical research program: Secondary | ICD-10-CM

## 2024-08-14 ENCOUNTER — Ambulatory Visit
Admission: RE | Admit: 2024-08-14 | Discharge: 2024-08-14 | Disposition: A | Discharge status: Home / Self Care | Payer: Self-pay | Source: Ambulatory Visit | Attending: Medical | Admitting: Medical

## 2024-08-14 DIAGNOSIS — M25562 Pain in left knee: Secondary | ICD-10-CM

## 2024-08-15 ENCOUNTER — Other Ambulatory Visit: Payer: Self-pay

## 2024-08-26 ENCOUNTER — Ambulatory Visit: Payer: Self-pay | Admitting: Nurse Practitioner

## 2024-08-27 LAB — GENECONNECT MOLECULAR SCREEN: Genetic Analysis Overall Interpretation: NEGATIVE

## 2024-09-20 ENCOUNTER — Ambulatory Visit
Admission: RE | Admit: 2024-09-20 | Discharge: 2024-09-20 | Disposition: A | Discharge status: Home / Self Care | Source: Ambulatory Visit | Attending: Family Medicine

## 2024-09-20 VITALS — BP 173/103 | HR 80 | Temp 98.0°F | Resp 17

## 2024-09-20 DIAGNOSIS — R1010 Upper abdominal pain, unspecified: Secondary | ICD-10-CM | POA: Diagnosis not present

## 2024-09-20 MED ORDER — PANTOPRAZOLE SODIUM 20 MG PO TBEC: 20.0000 mg | DELAYED_RELEASE_TABLET | Freq: Every day | ORAL | 0 refills | Status: DC

## 2024-09-20 NOTE — ED Triage Notes (Addendum)
 Pt present with c/o upper abdominal pain x 3 months. Pt states it has worsened over the past week. Pt states she has taken famotidine , no relief. Pt states she believes she has over used ibuprofen  and is concerned that has caused the pain. Pt states she has not taken ibuprofen  in the last two months.   Pt denies N-V-D.

## 2024-09-20 NOTE — Discharge Instructions (Addendum)
 Stop your omeprazole and start pantoprazole daily.  I have placed a referral to gastroenterology.  Please reach out to them as well to schedule an appointment.  Please note we do not do prior authorizations for these and that would need to go if your primary care provider.  Reach out to your orthopedist regarding your Celebrex as this can be aggravating your stomach, contributing to your symptoms.  Discussed with them possible alternatives.  Avoid spicy fried foods and NSAIDs.  Please go to the ER if you develop any worsening symptoms prior to seeing your PCP or gastroenterology.  Hope you feel better soon!

## 2024-09-20 NOTE — ED Provider Notes (Signed)
 [facility]    CSN: [id number] Arrival date & time: 09/20/24  1249      History   Chief Complaint Chief Complaint  Patient presents with   Abdominal Pain    Upper abdominal pain, sporadic. Began about 3 months ago, has gotten worse over the past week despite treatment with famotidine  and esomeprazole - Entered by patient    HPI [name] is a 73 y.o. female presents for abdominal pain.  Patient reports 3 months of an intermittent worsening upper abdominal pain that does not radiate.  States it is a burning sensation.  Reports pain is worse when she has an empty stomach and better when she eats.  Only has nausea when her stomach is empty.  No vomiting, diarrhea, bloating or belching.  No history of GI diagnoses such as Crohn's, IBS, colitis.  Does have GERD and has been taking famotidine  and recently started omeprazole as well.  She thinks it may be helping.  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  Had been taking over-the-counter ibuprofen /Aleve initially but stopped a couple months ago.  She was recently switched to Celebrex 2 weeks ago by her orthopedist.  She is otherwise eating and drinking normally.  No other concerns at this time.   Abdominal Pain   Past Medical History:  Diagnosis Date   Allergic rhinitis    Asthma    Body mass index (BMI) of 30.0-30.9 in adult    Chest pain    CKD (chronic kidney disease), stage III (HCC)    Depression    Estrogen deficiency    GERD (gastroesophageal reflux disease)    Hypercholesterolemia    Hypertension    Incisional hernia without obstruction or gangrene    Major depression single episode, in partial remission    Major depressive disorder, recurrent episode, in full remission    Mild persistent asthma without complication    Obesity    Other obesity due to excess calories    Palpitations    Reflux     Patient Active Problem List   Diagnosis Date Noted    Chest pain of uncertain etiology 03/05/2020   HTN (hypertension) 03/05/2020    Past Surgical History:  Procedure Laterality Date   WRIST SURGERY Left     OB History   No obstetric history on file.      Home Medications    Prior to Admission medications  Medication Sig Start Date End Date Taking? Authorizing Provider  pantoprazole (PROTONIX) 20 MG tablet Take 1 tablet (20 mg total) by mouth daily. 09/20/24 10/20/24 Yes Anaiz Qazi, Jodi R, NP  albuterol (VENTOLIN HFA) 108 (90 Base) MCG/ACT inhaler Inhale into the lungs every 6 (six) hours as needed for wheezing or shortness of breath.    [provider]  BREO ELLIPTA 200-25 MCG/INH AEPB Inhale 1 puff into the lungs daily. 01/20/20   [provider]  buPROPion  (WELLBUTRIN  XL) 150 MG 24 hr tablet Take 1 tablet (150 mg total) by mouth as needed. 07/23/24   Sherlynn Madden, MD  CALCIUM MAGNESIUM ZINC PO Take 1 tablet by mouth 3 (three) times daily.    [provider]  EPINEPHrine 0.3 mg/0.3 mL IJ SOAJ injection Inject 0.3 mg into the muscle as needed. 01/31/20   [provider]  famotidine  (PEPCID ) 40 MG tablet Take 1 tablet (40 mg total) by mouth daily. 07/16/24   Veludandi, Prashanthi, MD  fluticasone (FLONASE) 50 MCG/ACT nasal spray  Place 1 spray into both nostrils as needed.    [provider]  levocetirizine (XYZAL) 5 MG tablet Take 5 mg by mouth daily. 02/07/20   [provider]  losartan  (COZAAR ) 25 MG tablet Take 1 tablet (25 mg total) by mouth daily. 07/16/24   Sherlynn Madden, MD  Misc Natural Products (OSTEO BI-FLEX JOINT SHIELD PO) Take 1 tablet by mouth in the morning and at bedtime.    [provider]  montelukast (SINGULAIR) 10 MG tablet Take 10 mg by mouth daily. 01/05/20   [provider]  Multiple Vitamins-Minerals (WOMENS 50+ MULTI VITAMIN PO) Take 1 tablet by mouth daily.    [provider]  DOMINIC BECK 200-62.5-25 MCG/ACT AEPB Take 1  puff by mouth daily. 12/11/23   [provider]    Family History Family History  Problem Relation Age of Onset   Hypertension Mother    Diabetes Mother     Social History Social History[1]   Allergies   Other, Torsemide, Tramadol, and Tramadol   Review of Systems Review of Systems  Gastrointestinal:  Positive for abdominal pain.     Physical Exam Triage Vital Signs ED Triage Vitals  Encounter Vitals Group     BP 09/20/24 1326 (!) 173/103     Girls Systolic BP Percentile --      Girls Diastolic BP Percentile --      Boys Systolic BP Percentile --      Boys Diastolic BP Percentile --      Pulse Rate 09/20/24 1325 80     Resp 09/20/24 1325 17     Temp 09/20/24 1325 98 F (36.7 C)     Temp src --      SpO2 09/20/24 1325 93 %     Weight --      Height --      Head Circumference --      Peak Flow --      Pain Score 09/20/24 1324 5     Pain Loc --      Pain Education --      Exclude from Growth Chart --    No data found.  Updated Vital Signs BP (!) 173/103   Pulse 80   Temp 98 F (36.7 C)   Resp 17   SpO2 93%   Visual Acuity Right Eye Distance:   Left Eye Distance:   Bilateral Distance:    Right Eye Near:   Left Eye Near:    Bilateral Near:     Physical Exam Vitals and nursing note reviewed.  Constitutional:      General: She is not in acute distress.    Appearance: Normal appearance. She is not ill-appearing.  HENT:     Head: Normocephalic and atraumatic.  Eyes:     Pupils: Pupils are equal, round, and reactive to light.  Cardiovascular:     Rate and Rhythm: Normal rate.  Pulmonary:     Effort: Pulmonary effort is normal.  Abdominal:     General: Abdomen is flat. Bowel sounds are normal. There is no distension.     Palpations: Abdomen is soft. There is no hepatomegaly or splenomegaly.     Tenderness: There is abdominal tenderness in the epigastric area. There is no guarding or rebound. Negative signs include Rovsing's sign and  McBurney's sign.   Skin:    General: Skin is warm and dry.  Neurological:     General: No focal deficit present.     Mental Status:  She is alert and oriented to person, place, and time.  Psychiatric:        Mood and Affect: Mood normal.        Behavior: Behavior normal.      UC Treatments / Results  Labs (all labs ordered are listed, but only abnormal results are displayed) Labs Reviewed - No data to display  EKG   Radiology No results found.  Procedures Procedures (including critical care time)  Medications Ordered in UC Medications - No data to display  Initial Impression / Assessment and Plan / UC Course  I have reviewed the triage vital signs and the nursing notes.  Pertinent labs & imaging results that were available during my care of the patient were reviewed by me and considered in my medical decision making (see chart for details).     Reviewed exam and symptoms with patient.  No red flags.  Patient presenting with 3 months of intermittent worsening upper abdominal pain.  Pain is better with eating and worse on an empty stomach.  Recent chronic NSAID usage for back pain.  Discussed possible PUD, will have her stop omeprazole and do pantoprazole and also have her discuss with her orthopedist alternate to Celebrex as this could be contributing to some of her symptoms.  I did replace referral to GI as courtesy but did advise her that we do not follow-up on referrals or do preauthorization's regarding this and she verbalized understanding.  She is to reach out to her PCP as well and follow-up as soon as possible.  ER precautions reviewed and patient verbalized understanding Final Clinical Impressions(s) / UC Diagnoses   Final diagnoses:  Upper abdominal pain     Discharge Instructions      Stop your omeprazole and start pantoprazole daily.  I have placed a referral to gastroenterology.  Please reach out to them as well to schedule an appointment.  Please note we  do not do prior authorizations for these and that would need to go if your primary care provider.  Reach out to your orthopedist regarding your Celebrex as this can be aggravating your stomach, contributing to your symptoms.  Discussed with them possible alternatives.  Avoid spicy fried foods and NSAIDs.  Please go to the ER if you develop any worsening symptoms prior to seeing your PCP or gastroenterology.  Hope you feel better soon!    ED Prescriptions     Medication Sig Dispense Auth. Provider   pantoprazole (PROTONIX) 20 MG tablet Take 1 tablet (20 mg total) by mouth daily. 30 tablet [place] Stead, Jodi R, NP      PDMP not reviewed this encounter.    [1]  Social History Tobacco Use   Smoking status: Unknown   Smokeless tobacco: Never     Carrie Myla SAUNDERS, NP 09/20/24 1356

## 2024-09-23 ENCOUNTER — Encounter: Payer: Self-pay | Admitting: Gastroenterology

## 2024-10-11 ENCOUNTER — Encounter: Payer: Self-pay | Admitting: Nurse Practitioner

## 2024-10-11 ENCOUNTER — Ambulatory Visit: Admitting: Nurse Practitioner

## 2024-10-11 VITALS — BP 154/98 | HR 76 | Temp 97.8°F | Ht 64.0 in | Wt 157.6 lb

## 2024-10-11 DIAGNOSIS — Z1159 Encounter for screening for other viral diseases: Secondary | ICD-10-CM | POA: Diagnosis not present

## 2024-10-11 DIAGNOSIS — M4316 Spondylolisthesis, lumbar region: Secondary | ICD-10-CM | POA: Diagnosis not present

## 2024-10-11 DIAGNOSIS — F325 Major depressive disorder, single episode, in full remission: Secondary | ICD-10-CM

## 2024-10-11 DIAGNOSIS — I10 Essential (primary) hypertension: Secondary | ICD-10-CM

## 2024-10-11 DIAGNOSIS — K219 Gastro-esophageal reflux disease without esophagitis: Secondary | ICD-10-CM | POA: Diagnosis not present

## 2024-10-11 DIAGNOSIS — R0981 Nasal congestion: Secondary | ICD-10-CM

## 2024-10-11 DIAGNOSIS — J4520 Mild intermittent asthma, uncomplicated: Secondary | ICD-10-CM

## 2024-10-11 DIAGNOSIS — J309 Allergic rhinitis, unspecified: Secondary | ICD-10-CM

## 2024-10-11 LAB — POCT INFLUENZA A/B
Influenza A, POC: NEGATIVE
Influenza B, POC: NEGATIVE

## 2024-10-11 LAB — POC COVID19 BINAXNOW: SARS Coronavirus 2 Ag: NEGATIVE

## 2024-10-11 MED ORDER — BUPROPION HCL ER (XL) 150 MG PO TB24: 150.0000 mg | ORAL_TABLET | Freq: Every day | ORAL | 3 refills | Status: AC

## 2024-10-11 MED ORDER — PANTOPRAZOLE SODIUM 40 MG PO TBEC: 40.0000 mg | DELAYED_RELEASE_TABLET | Freq: Every day | ORAL | 3 refills | Status: DC

## 2024-10-11 NOTE — Patient Instructions (Addendum)
 To schedule AWV on check out- overdue   Netipot or saline wash daily Plain nasal saline spray throughout the day as needed May use tylenol  325 mg 2 tablets every 6-8 hours as needed aches and pains or sore throat humidifier in the home to help with the dry air Mucinex DM by mouth twice daily as needed for cough and chest congestion with full glass of water  Keep well hydrated Proper nutrition  Avoid forcefully blowing nose Vit c 1000 mg daily

## 2024-10-11 NOTE — Progress Notes (Signed)
 "   Careteam: Patient Care Team: Caro Harlene POUR, NP as PCP - General (Geriatric Medicine) Nahser, Aleene PARAS, MD (Inactive) as PCP - Cardiology (Cardiology) Brien Charleston, MD (Family Medicine)  PLACE OF SERVICE:  [facility]  Advanced Directive information    Allergies[1]  Chief Complaint  Patient presents with   Medical Management of Chronic Issues    6 week follow up    Nasal Congestion    Cold symptoms. Began Wednesday   GI Problem    Patient does have an appointment on the 20th with gastroenterology. She was seen by urgent care 09/20/24    HPI:  Discussed the use of AI scribe software for clinical note transcription with the patient, who gave verbal consent to proceed.  History of Present Illness [name] is a 74 year old female presents for a six-week follow-up visit.  Her blood pressure was elevated during her last visit and therefore losartan  was added.  She takes losartan  25 mg daily and monitors her blood pressure at home, where it typically reads around 135/88 mmHg.   She has been experiencing sinus congestion for 2 days, with significant sinus pressure causing discomfort in her teeth. She uses Flonase, Afrin, and occasionally Claritin D for relief.  Her asthma is managed with Trelegy, one puff daily, and Singulair. She has not experienced an asthma flare in recent memory. She also takes Xyzal for allergic rhinitis. She is followed by allergy and asthma clinic.   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  She has a history of indigestion and acid reflux, for which she takes Pepcid  daily. She recently began experiencing severe stomach issues, possibly related to NSAID use for spondylolisthesis pain. She has an upcoming appointment with a gastroenterologist and is currently taking Protonix , which has helped symptoms but still ongoing.   She is undergoing  physical therapy for spondylolisthesis and reports that adherence to exercises helps manage her back pain. She had an epidural steroid injection on December 17th, which did not significantly improve her symptoms but did not worsen them either. She continues to manage her condition with physical therapy exercises.  She has experienced weight loss, which she attributes to intentional efforts.   Review of Systems:  Review of Systems  Constitutional:  Negative for chills, fever and weight loss.  HENT:  Positive for congestion and sore throat. Negative for tinnitus.   Respiratory:  Negative for cough, sputum production and shortness of breath.   Cardiovascular:  Negative for chest pain, palpitations and leg swelling.  Gastrointestinal:  Positive for abdominal pain and heartburn. Negative for constipation and diarrhea.  Genitourinary:  Negative for dysuria, frequency and urgency.  Musculoskeletal:  Positive for back pain. Negative for falls, joint pain and myalgias.  Skin: Negative.   Neurological:  Negative for dizziness and headaches.  Psychiatric/Behavioral:  Negative for depression and memory loss. The patient does not have insomnia.     Past Medical History:  Diagnosis Date   Allergic rhinitis    Asthma    Body mass index (BMI) of 30.0-30.9 in adult    Chest pain    CKD (chronic kidney disease), stage III (HCC)    Depression    Estrogen deficiency    GERD (gastroesophageal reflux disease)    Hypercholesterolemia    Hypertension    Incisional hernia without obstruction or gangrene    Major depression single episode, in partial remission  Major depressive disorder, recurrent episode, in full remission    Mild persistent asthma without complication    Obesity    Other obesity due to excess calories    Palpitations    Reflux    Past Surgical History:  Procedure Laterality Date   WRIST SURGERY Left    Social History:   reports that she has an unknown smoking status. She has  never used smokeless tobacco. No history on file for alcohol use and drug use.  Family History  Problem Relation Age of Onset   Hypertension Mother    Diabetes Mother     Medications: Patient's Medications  New Prescriptions   No medications on file  Previous Medications   ALBUTEROL (VENTOLIN HFA) 108 (90 BASE) MCG/ACT INHALER    Inhale into the lungs every 6 (six) hours as needed for wheezing or shortness of breath.   BREO ELLIPTA 200-25 MCG/INH AEPB    Inhale 1 puff into the lungs daily.   BUPROPION  (WELLBUTRIN  XL) 150 MG 24 HR TABLET    Take 1 tablet (150 mg total) by mouth as needed.   CALCIUM MAGNESIUM ZINC PO    Take 1 tablet by mouth 3 (three) times daily.   EPINEPHRINE 0.3 MG/0.3 ML IJ SOAJ INJECTION    Inject 0.3 mg into the muscle as needed.   FAMOTIDINE  (PEPCID ) 40 MG TABLET    Take 1 tablet (40 mg total) by mouth daily.   FLUTICASONE (FLONASE) 50 MCG/ACT NASAL SPRAY    Place 1 spray into both nostrils as needed.   LEVOCETIRIZINE (XYZAL) 5 MG TABLET    Take 5 mg by mouth daily.   LOSARTAN  (COZAAR ) 25 MG TABLET    Take 1 tablet (25 mg total) by mouth daily.   MISC NATURAL PRODUCTS (OSTEO BI-FLEX JOINT SHIELD PO)    Take 1 tablet by mouth in the morning and at bedtime.   MONTELUKAST (SINGULAIR) 10 MG TABLET    Take 10 mg by mouth daily.   MULTIPLE VITAMINS-MINERALS (WOMENS 50+ MULTI VITAMIN PO)    Take 1 tablet by mouth daily.   PANTOPRAZOLE  (PROTONIX ) 20 MG TABLET    Take 1 tablet (20 mg total) by mouth daily.   TRELEGY ELLIPTA 200-62.5-25 MCG/ACT AEPB    Take 1 puff by mouth daily.  Modified Medications   No medications on file  Discontinued Medications   No medications on file    Physical Exam:  Vitals:   10/11/24 1022  BP: (!) 154/98  Pulse: 76  Temp: 97.8 F (36.6 C)  TempSrc: Temporal  SpO2: 100%  Weight: 157 lb 9.6 oz (71.5 kg)  Height: 5' 4 (1.626 m)   Body mass index is 27.05 kg/m. Wt Readings from Last 3 Encounters:  10/11/24 157 lb 9.6 oz (71.5  kg)  07/16/24 158 lb 9.6 oz (71.9 kg)  03/05/20 170 lb 4 oz (77.2 kg)    Physical Exam Constitutional:      General: She is not in acute distress.    Appearance: She is well-developed. She is not diaphoretic.  HENT:     Head: Normocephalic and atraumatic.     Mouth/Throat:     Pharynx: No oropharyngeal exudate.  Eyes:     Conjunctiva/sclera: Conjunctivae normal.     Pupils: Pupils are equal, round, and reactive to light.  Cardiovascular:     Rate and Rhythm: Normal rate and regular rhythm.     Heart sounds: Normal heart sounds.  Pulmonary:     Effort: Pulmonary  effort is normal.     Breath sounds: Normal breath sounds.  Abdominal:     General: Bowel sounds are normal.     Palpations: Abdomen is soft.  Musculoskeletal:     Cervical back: Normal range of motion and neck supple.     Right lower leg: No edema.     Left lower leg: No edema.  Skin:    General: Skin is warm and dry.  Neurological:     Mental Status: She is alert.  Psychiatric:        Mood and Affect: Mood normal.     Labs reviewed: Basic Metabolic Panel: Recent Labs    07/23/24 0848  NA 136  K 4.4  CL 101  CO2 27  GLUCOSE 87  BUN 10  CREATININE 0.82  CALCIUM 9.5  TSH 4.39   Liver Function Tests: Recent Labs    07/23/24 0848  AST 19  ALT 18  BILITOT 0.6  PROT 5.9*   No results for input(s): LIPASE, AMYLASE in the last 8760 hours. No results for input(s): AMMONIA in the last 8760 hours. CBC: Recent Labs    07/23/24 0848  WBC 5.6  HGB 13.8  HCT 42.1  MCV 88.8  PLT 276   Lipid Panel: Recent Labs    07/23/24 0848  CHOL 199  HDL 87  LDLCALC 92  TRIG 103  CHOLHDL 2.3   TSH: Recent Labs    07/23/24 0848  TSH 4.39   A1C: No results found for: HGBA1C   Assessment/Plan Assessment and Plan Assessment & Plan Nasal congestion and allergic rhinitis Acute nasal congestion and allergic rhinitis with sinus pressure Afrin use limited to three days to prevent rebound  congestion. Claritin D may elevate blood pressure advise to avoid - Continue Flonase and Xyzal. - Limit Afrin use to three days. - Use saline nasal spray and avoid forceful nose blowing. - Consider using a humidifier. - Increase water intake. - Consider Mucinex DM if symptoms progress to chest involvement.  Primary hypertension Blood pressure slightly elevated at home. Claritin D has Sudafed may contribute to elevated blood pressure. - Continue losartan  25 mg daily. - Monitor blood pressure at home. - Avoid Claritin D.   Mild intermittent asthma Asthma well-controlled with Trelegy and Singulair. No recent flares. Follow-up with allergist scheduled for March. - Continue Trelegy one puff daily. - Continue Singulair daily.  Major depressive disorder in remission Depression in remission with Wellbutrin  150 mg daily and controlled.  - Continue Wellbutrin  150 mg daily. - Sent prescription to pharmacy for refill.  Gastroesophageal reflux disease GERD symptoms exacerbated by NSAID use.  - Increased pantoprazole  to 40 mg daily. - Avoid NSAIDs such as Advil , Aleve, and Motrin . - Scheduled follow-up with gastroenterologist on January 20th.  Lumbar spondylolisthesis Chronic lumbar spondylolisthesis managed with physical therapy and ESI. No significant improvement from [facility], no worsening of symptoms. Per patient MRI showed no nerve damage, surgery not indicated. - Continue physical therapy exercises at home. - Avoid NSAIDs due to potential kidney damage. -continue specialist follow up    Return in about 3 months (around 01/09/2025) for routine follow up.  Kaisha Wachob K. Caro BODILY [facility] [phone]      [1]  Allergies Allergen Reactions   Other Shortness Of Breath   Torsemide Other (See Comments)    Leg cramps   Tramadol Other (See Comments)    dizzy   Tramadol Other (See Comments)    dizziness   "

## 2024-10-12 LAB — COMPREHENSIVE METABOLIC PANEL WITH GFR
AG Ratio: 2 (calc) (ref 1.0–2.5)
ALT: 18 U/L (ref 6–29)
AST: 18 U/L (ref 10–35)
Albumin: 3.9 g/dL (ref 3.6–5.1)
Alkaline phosphatase (APISO): 85 U/L (ref 37–153)
BUN: 10 mg/dL (ref 7–25)
CO2: 26 mmol/L (ref 20–32)
Calcium: 9.5 mg/dL (ref 8.6–10.4)
Chloride: 99 mmol/L (ref 98–110)
Creat: 0.81 mg/dL (ref 0.60–1.00)
Globulin: 2 g/dL (ref 1.9–3.7)
Glucose, Bld: 103 mg/dL (ref 65–139)
Potassium: 4.5 mmol/L (ref 3.5–5.3)
Sodium: 133 mmol/L — ABNORMAL LOW (ref 135–146)
Total Bilirubin: 0.6 mg/dL (ref 0.2–1.2)
Total Protein: 5.9 g/dL — ABNORMAL LOW (ref 6.1–8.1)
eGFR: 76 mL/min/1.73m2

## 2024-10-12 LAB — CBC WITH DIFFERENTIAL/PLATELET
Absolute Lymphocytes: 990 {cells}/uL (ref 850–3900)
Absolute Monocytes: 1049 {cells}/uL — ABNORMAL HIGH (ref 200–950)
Basophils Absolute: 40 {cells}/uL (ref 0–200)
Basophils Relative: 0.6 %
Eosinophils Absolute: 205 {cells}/uL (ref 15–500)
Eosinophils Relative: 3.1 %
HCT: 41 % (ref 35.9–46.0)
Hemoglobin: 13.7 g/dL (ref 11.7–15.5)
MCH: 29.7 pg (ref 27.0–33.0)
MCHC: 33.4 g/dL (ref 31.6–35.4)
MCV: 88.7 fL (ref 81.4–101.7)
MPV: 10.2 fL (ref 7.5–12.5)
Monocytes Relative: 15.9 %
Neutro Abs: 4316 {cells}/uL (ref 1500–7800)
Neutrophils Relative %: 65.4 %
Platelets: 270 Thousand/uL (ref 140–400)
RBC: 4.62 Million/uL (ref 3.80–5.10)
RDW: 12.9 % (ref 11.0–15.0)
Total Lymphocyte: 15 %
WBC: 6.6 Thousand/uL (ref 3.8–10.8)

## 2024-10-12 LAB — HEPATITIS C ANTIBODY: Hepatitis C Ab: NONREACTIVE

## 2024-10-14 ENCOUNTER — Ambulatory Visit: Payer: Self-pay | Admitting: Nurse Practitioner

## 2024-10-28 NOTE — Progress Notes (Unsigned)
 "  Chief Complaint: Primary GI MD:  HPI:  *** is a  ***  who was referred to me by Caro Harlene POUR, NP for a complaint of *** .    Previous patient of [facility].  We do not have any records from them.    Discussed the use of AI scribe software for clinical note transcription with the patient, who gave verbal consent to proceed.  History of Present Illness      PREVIOUS GI WORKUP     Past Medical History:  Diagnosis Date   Allergic rhinitis    Asthma    Body mass index (BMI) of 30.0-30.9 in adult    Chest pain    CKD (chronic kidney disease), stage III (HCC)    Depression    Estrogen deficiency    GERD (gastroesophageal reflux disease)    Hypercholesterolemia    Hypertension    Incisional hernia without obstruction or gangrene    Major depression single episode, in partial remission    Major depressive disorder, recurrent episode, in full remission    Mild persistent asthma without complication    Obesity    Other obesity due to excess calories    Palpitations    Reflux     Past Surgical History:  Procedure Laterality Date   WRIST SURGERY Left     Current Outpatient Medications  Medication Sig Dispense Refill   albuterol (VENTOLIN HFA) 108 (90 Base) MCG/ACT inhaler Inhale into the lungs every 6 (six) hours as needed for wheezing or shortness of breath.     buPROPion  (WELLBUTRIN  XL) 150 MG 24 hr tablet Take 1 tablet (150 mg total) by mouth daily. 90 tablet 3   CALCIUM MAGNESIUM ZINC PO Take 1 tablet by mouth 3 (three) times daily.     EPINEPHrine 0.3 mg/0.3 mL IJ SOAJ injection Inject 0.3 mg into the muscle as needed.     famotidine  (PEPCID ) 40 MG tablet Take 1 tablet (40 mg total) by mouth daily. 120 tablet 2   fluticasone (FLONASE) 50 MCG/ACT nasal spray Place 1 spray into both nostrils as needed.     levocetirizine (XYZAL) 5 MG tablet Take 5 mg by mouth daily.     losartan  (COZAAR ) 25 MG tablet Take 1 tablet (25 mg total) by mouth daily. 90 tablet 1    Misc Natural Products (OSTEO BI-FLEX JOINT SHIELD PO) Take 1 tablet by mouth in the morning and at bedtime.     montelukast (SINGULAIR) 10 MG tablet Take 10 mg by mouth daily.     Multiple Vitamins-Minerals (WOMENS 50+ MULTI VITAMIN PO) Take 1 tablet by mouth daily.     pantoprazole  (PROTONIX ) 40 MG tablet Take 1 tablet (40 mg total) by mouth daily. 30 tablet 3   TRELEGY ELLIPTA 200-62.5-25 MCG/ACT AEPB Take 1 puff by mouth daily.     No current facility-administered medications for this visit.    Allergies as of 10/29/2024 - Review Complete 10/11/2024  Allergen Reaction Noted   Other Shortness Of Breath 07/16/2024   Torsemide Other (See Comments) 07/16/2024   Tramadol Other (See Comments) 03/06/2014   Tramadol Other (See Comments) 12/26/2016    Family History  Problem Relation Age of Onset   Hypertension Mother    Diabetes Mother     Social History   Socioeconomic History   Marital status: Widowed    Spouse name: Not on file   Number of children: Not on file   Years of education: Not on file  Highest education level: Master's degree (e.g., MA, MS, MEng, MEd, MSW, MBA)  Occupational History   Not on file  Tobacco Use   Smoking status: Unknown   Smokeless tobacco: Never  Substance and Sexual Activity   Alcohol use: Not on file   Drug use: Not on file   Sexual activity: Not on file  Other Topics Concern   Not on file  Social History Narrative   Not on file   Social Drivers of Health   Tobacco Use: Unknown (10/11/2024)   Patient History    Smoking Tobacco Use: Unknown    Smokeless Tobacco Use: Never    Passive Exposure: Not on file  Financial Resource Strain: Low Risk (10/08/2024)   Overall Financial Resource Strain (CARDIA)    Difficulty of Paying Living Expenses: Not very hard  Food Insecurity: No Food Insecurity (10/08/2024)   Epic    Worried About Programme Researcher, Broadcasting/film/video in the Last Year: Never true    Ran Out of Food in the Last Year: Never true  Transportation  Needs: No Transportation Needs (10/08/2024)   Epic    Lack of Transportation (Medical): No    Lack of Transportation (Non-Medical): No  Physical Activity: Unknown (10/08/2024)   Exercise Vital Sign    Days of Exercise per Week: Patient declined    Minutes of Exercise per Session: Not on file  Stress: Stress Concern Present (10/08/2024)   [employer] of Occupational Health - Occupational Stress Questionnaire    Feeling of Stress: To some extent  Social Connections: Moderately Isolated (10/08/2024)   Social Connection and Isolation Panel    Frequency of Communication with Friends and Family: More than three times a week    Frequency of Social Gatherings with Friends and Family: More than three times a week    Attends Religious Services: 1 to 4 times per year    Active Member of [employer] or Organizations: No    Attends Banker Meetings: Not on file    Marital Status: Widowed  Intimate Partner Violence: Not At Risk (07/16/2024)   Epic    Fear of Current or Ex-Partner: No    Emotionally Abused: No    Physically Abused: No    Sexually Abused: No  Depression (PHQ2-9): Low Risk (10/11/2024)   Depression (PHQ2-9)    PHQ-2 Score: 0  Alcohol Screen: Low Risk (10/08/2024)   Alcohol Screen    Last Alcohol Screening Score (AUDIT): 2  Housing: Low Risk (10/08/2024)   Epic    Unable to Pay for Housing in the Last Year: No    Number of Times Moved in the Last Year: 0    Homeless in the Last Year: No  Utilities: Not At Risk (07/16/2024)   Epic    Threatened with loss of utilities: No  Health Literacy: Not on file    Review of Systems:    Constitutional: No weight loss, fever, chills, weakness or fatigue HEENT: Eyes: No change in vision               Ears, Nose, Throat:  No change in hearing or congestion Skin: No rash or itching Cardiovascular: No chest pain, chest pressure or palpitations   Respiratory: No SOB or cough Gastrointestinal: See HPI and otherwise  negative Genitourinary: No dysuria or change in urinary frequency Neurological: No headache, dizziness or syncope Musculoskeletal: No new muscle or joint pain Hematologic: No bleeding or bruising Psychiatric: No history of depression or anxiety    Physical Exam:  Vital signs: There were no vitals taken for this visit.  Constitutional: NAD, alert and cooperative Head:  Normocephalic and atraumatic. Eyes:   PEERL, EOMI. No icterus. Conjunctiva pink. Respiratory: Respirations even and unlabored. Lungs clear to auscultation bilaterally.   No wheezes, crackles, or rhonchi.  Cardiovascular:  Regular rate and rhythm. No peripheral edema, cyanosis or pallor.  Gastrointestinal:  Soft, nondistended, nontender. No rebound or guarding. Normal bowel sounds. No appreciable masses or hepatomegaly. Rectal:  Declines Msk:  Symmetrical without gross deformities. Without edema, no deformity or joint abnormality.  Neurologic:  Alert and  oriented x4;  grossly normal neurologically.  Skin:   Dry and intact without significant lesions or rashes. Psychiatric: Oriented to person, place and time. Demonstrates good judgement and reason without abnormal affect or behaviors.  Physical Exam    RELEVANT LABS AND IMAGING: CBC    Component Value Date/Time   WBC 6.6 10/11/2024 1055   RBC 4.62 10/11/2024 1055   HGB 13.7 10/11/2024 1055   HCT 41.0 10/11/2024 1055   PLT 270 10/11/2024 1055   MCV 88.7 10/11/2024 1055   MCH 29.7 10/11/2024 1055   MCHC 33.4 10/11/2024 1055   RDW 12.9 10/11/2024 1055   EOSABS 205 10/11/2024 1055   BASOSABS 40 10/11/2024 1055    CMP     Component Value Date/Time   NA 133 (L) 10/11/2024 1055   K 4.5 10/11/2024 1055   CL 99 10/11/2024 1055   CO2 26 10/11/2024 1055   GLUCOSE 103 10/11/2024 1055   BUN 10 10/11/2024 1055   CREATININE 0.81 10/11/2024 1055   CALCIUM 9.5 10/11/2024 1055   PROT 5.9 (L) 10/11/2024 1055   ALBUMIN 3.6 12/26/2016 1456   AST 18 10/11/2024 1055    ALT 18 10/11/2024 1055   ALKPHOS 58 12/26/2016 1456   BILITOT 0.6 10/11/2024 1055   GFRNONAA 56 (L) 12/26/2016 1456   GFRAA >60 12/26/2016 1456     Assessment/Plan:   Epigastric pain Burning epigastric pain ongoing for 3 months, exacerbated with eating.  History of extensive NSAID use with ibuprofen , Aleve, and Celebrex due to chronic low back pain.  No previous EGD.  Normal labs.  On pantoprazole  20 mg daily   Espiridion Supinski Mollie RIGGERS [facility] 10/28/2024, 9:26 PM  Cc: Caro Harlene POUR, NP "

## 2024-10-29 ENCOUNTER — Encounter: Payer: Self-pay | Admitting: Gastroenterology

## 2024-10-29 ENCOUNTER — Ambulatory Visit: Admitting: Gastroenterology

## 2024-10-29 VITALS — BP 146/98 | HR 79 | Ht 64.0 in | Wt 155.4 lb

## 2024-10-29 DIAGNOSIS — K5909 Other constipation: Secondary | ICD-10-CM

## 2024-10-29 DIAGNOSIS — K219 Gastro-esophageal reflux disease without esophagitis: Secondary | ICD-10-CM

## 2024-10-29 DIAGNOSIS — Z1211 Encounter for screening for malignant neoplasm of colon: Secondary | ICD-10-CM

## 2024-10-29 DIAGNOSIS — K649 Unspecified hemorrhoids: Secondary | ICD-10-CM

## 2024-10-29 DIAGNOSIS — K5900 Constipation, unspecified: Secondary | ICD-10-CM

## 2024-10-29 DIAGNOSIS — R1013 Epigastric pain: Secondary | ICD-10-CM

## 2024-10-29 NOTE — Patient Instructions (Addendum)
 _______________________________________________________  If your blood pressure at your visit was 140/90 or greater, please contact your primary care physician to follow up on this.  _______________________________________________________  If you are age 76 or older, your body mass index should be between 23-30. Your Body mass index is 26.67 kg/m. If this is out of the aforementioned range listed, please consider follow up with your Primary Care Provider.  If you are age 78 or younger, your body mass index should be between 19-25. Your Body mass index is 26.67 kg/m. If this is out of the aformentioned range listed, please consider follow up with your Primary Care Provider.   ________________________________________________________  The [facility] providers would like to encourage you to use MYCHART to communicate with providers for non-urgent requests or questions.  Due to long hold times on the telephone, sending your provider a message by [facility] may be a faster and more efficient way to get a response.  Please allow 48 business hours for a response.  Please remember that this is for non-urgent requests.  _______________________________________________________  [facility] is using a team-based approach to care.  Your team is made up of your doctor and two to three APPS. Our APPS (Nurse Practitioners and Physician Assistants) work with your physician to ensure care continuity for you. They are fully qualified to address your health concerns and develop a treatment plan. They communicate directly with your gastroenterologist to care for you. Seeing the Advanced Practice Practitioners on your physician's team can help you by facilitating care more promptly, often allowing for earlier appointments, access to diagnostic testing, procedures, and other specialty referrals.   You have been scheduled for an endoscopy. Please follow written instructions given to you at your visit  today.  If you use inhalers (even only as needed), please bring them with you on the day of your procedure.  If you take any of the following medications, they will need to be adjusted prior to your procedure:   DO NOT TAKE 7 DAYS PRIOR TO TEST- Trulicity (dulaglutide) Ozempic, Wegovy (semaglutide) Mounjaro, Zepbound (tirzepatide) Bydureon Bcise (exanatide extended release)  DO NOT TAKE 1 DAY PRIOR TO YOUR TEST Rybelsus (semaglutide) Adlyxin (lixisenatide) Victoza (liraglutide) Byetta (exanatide) ___________________________________________________________________________   A high fiber diet with plenty of fluids (up to 8 glasses of water daily) is suggested to relieve these symptoms.  benefiber, 1 tablespoon once or twice daily can be used to keep bowels regular if needed.   Start taking Miralax 1 capful (17 grams) 1x / day for 1 week.   If this is not effective, increase to 1 dose 2x / day for 1 week.   If this is still not effective, increase to two capfuls (34 grams) 2x / day.   Can adjust dose as needed based on response. Can take 1/2 cap daily, skip days, or increase per day.     VISIT SUMMARY:  During your visit, we discussed your persistent upper gastrointestinal symptoms, chronic constipation, and hemorrhoids. We reviewed your current medications and history, and we have planned further evaluation and adjustments to your treatment plan.  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 This can be caused by long-term use of NSAIDs. We have ordered an esophagogastroduodenoscopy (EGD) to check for ulcers and inflammation. Continue taking pantoprazole  40 mg daily until the EGD, and avoid NSAIDs completely.  -GASTROESOPHAGEAL REFLUX DISEASE: Gastroesophageal reflux disease (GERD) is a chronic  condition where stomach acid frequently flows back into the tube connecting  your mouth and stomach. Continue taking famotidine  40 mg daily and avoid dietary triggers like carbonated beverages. We may take a biopsy during the EGD to check for Barrett's esophagus.  -CONSTIPATION: Constipation is a condition where you have difficulty passing stools. We recommend switching to Benefiber for daily fiber supplementation and adding Miralax if needed. Consider using a squatty potty to help with bowel movements and limit Tums intake.  -HEMORRHOIDS: Hemorrhoids are swollen veins in your lower rectum and anus that can cause discomfort. To manage this, take sitz baths or hot baths to reduce swelling, focus on softening your stools, and use a squatty potty to reduce straining.  -PLANNED ESOPHAGOGASTRODUODENOSCOPY: An esophagogastroduodenoscopy (EGD) is a procedure to examine the lining of your esophagus, stomach, and the first part of your small intestine. We have scheduled an EGD to evaluate your upper GI symptoms. The procedure will be on the fourth floor, involves sedation, and requires IV access. You will need a driver to take you home after the procedure.  INSTRUCTIONS:  Please follow up with the scheduled EGD and continue with the current medication regimen. Avoid NSAIDs and follow the dietary and lifestyle recommendations provided. If you have any questions or concerns, please contact our office.

## 2024-10-29 NOTE — Addendum Note (Signed)
 Addended by: KATHIE BOTTCHER E on: 10/29/2024 10:27 AM   Modules accepted: Orders

## 2024-10-29 NOTE — Addendum Note (Signed)
 Addended by: KATHIE BOTTCHER E on: 10/29/2024 10:06 AM   Modules accepted: Orders

## 2024-11-04 ENCOUNTER — Encounter: Payer: Self-pay | Admitting: Internal Medicine

## 2024-11-06 ENCOUNTER — Encounter: Payer: Self-pay | Admitting: *Deleted

## 2024-11-11 ENCOUNTER — Encounter: Admitting: Internal Medicine

## 2024-11-13 ENCOUNTER — Ambulatory Visit: Admitting: Internal Medicine

## 2024-11-13 ENCOUNTER — Encounter: Payer: Self-pay | Admitting: Internal Medicine

## 2024-11-13 VITALS — BP 155/94 | HR 69 | Temp 97.2°F | Resp 17 | Ht 64.0 in | Wt 155.6 lb

## 2024-11-13 DIAGNOSIS — K219 Gastro-esophageal reflux disease without esophagitis: Secondary | ICD-10-CM

## 2024-11-13 DIAGNOSIS — K253 Acute gastric ulcer without hemorrhage or perforation: Secondary | ICD-10-CM

## 2024-11-13 DIAGNOSIS — R1013 Epigastric pain: Secondary | ICD-10-CM

## 2024-11-13 MED ORDER — PANTOPRAZOLE SODIUM 40 MG PO TBEC: 40.0000 mg | DELAYED_RELEASE_TABLET | Freq: Every day | ORAL | 3 refills | Status: AC

## 2024-11-13 MED ORDER — SODIUM CHLORIDE 0.9 % IV SOLN: 500.0000 mL | Freq: Once | INTRAVENOUS | Status: DC

## 2024-11-13 NOTE — Patient Instructions (Addendum)
 Resume previous diet. Continue present medications.  Await pathology results. Continue pantoprazole  40 mg daily with famotidine  40 mg in the evening x 8 weeks. Can stop famotidine  after 8 weeks and continue pantoprazole  40 mg daily for GERD.   YOU HAD AN ENDOSCOPIC PROCEDURE TODAY AT THE [facility]:   Refer to the procedure report that was given to you for any specific questions about what was found during the examination.  If the procedure report does not answer your questions, please call your gastroenterologist to clarify.  If you requested that your care partner not be given the details of your procedure findings, then the procedure report has been included in a sealed envelope for you to review at your convenience later.  YOU SHOULD EXPECT: Some feelings of bloating in the abdomen. Passage of more gas than usual.  Walking can help get rid of the air that was put into your GI tract during the procedure and reduce the bloating. If you had a lower endoscopy (such as a colonoscopy or flexible sigmoidoscopy) you may notice spotting of blood in your stool or on the toilet paper. If you underwent a bowel prep for your procedure, you may not have a normal bowel movement for a few days.  Please Note:  You might notice some irritation and congestion in your nose or some drainage.  This is from the oxygen used during your procedure.  There is no need for concern and it should clear up in a day or so.  SYMPTOMS TO REPORT IMMEDIATELY:  Following upper endoscopy (EGD)  Vomiting of blood or coffee ground material  New chest pain or pain under the shoulder blades  Painful or persistently difficult swallowing  New shortness of breath  Fever of 100F or higher  Black, tarry-looking stools  For urgent or emergent issues, a gastroenterologist can be reached at any hour by calling [phone]. Do not use MyChart messaging for urgent concerns.    DIET:  We do recommend a small meal at  first, but then you may proceed to your regular diet.  Drink plenty of fluids but you should avoid alcoholic beverages for 24 hours.  ACTIVITY:  You should plan to take it easy for the rest of today and you should NOT DRIVE or use heavy machinery until tomorrow (because of the sedation medicines used during the test).    FOLLOW UP: Our staff will call the number listed on your records the next business day following your procedure.  We will call around 7:15- 8:00 am to check on you and address any questions or concerns that you may have regarding the information given to you following your procedure. If we do not reach you, we will leave a message.     If any biopsies were taken you will be contacted by phone or by letter within the next 1-3 weeks.  Please call us  at [phone] if you have not heard about the biopsies in 3 weeks.    SIGNATURES/CONFIDENTIALITY: You and/or your care partner have signed paperwork which will be entered into your electronic medical record.  These signatures attest to the fact that that the information above on your After Visit Summary has been reviewed and is understood.  Full responsibility of the confidentiality of this discharge information lies with you and/or your care-partner.

## 2024-11-13 NOTE — Progress Notes (Unsigned)
 Called to room to assist during endoscopic procedure.  Patient ID and intended procedure confirmed with present staff. Received instructions for my participation in the procedure from the performing physician.

## 2024-11-13 NOTE — Op Note (Signed)
 [facility] Patient Name: [name] Procedure Date: 11/13/2024 2:47 PM MRN: [id number] Endoscopist: Gordy CHRISTELLA Starch , MD, [id number] Age: 74 Referring MD:  Date of Birth: [birth year] Gender: Female Account #: [id number] Procedure:                Upper GI endoscopy Indications:              Epigastric abdominal pain, Gastro-esophageal reflux                            disease; NSAID use and on pantoprazole  20 mg in Dec                            increased to 40 mg daily in Jan and now                            pantoprazole  40 mg and famotidine  40 mg daily Medicines:                Monitored Anesthesia Care Procedure:                Pre-Anesthesia Assessment:                           - Prior to the procedure, a History and Physical                            was performed, and patient medications and                            allergies were reviewed. The patient's tolerance of                            previous anesthesia was also reviewed. The risks                            and benefits of the procedure and the sedation                            options and risks were discussed with the patient.                            All questions were answered, and informed consent                            was obtained. Prior Anticoagulants: The patient has                            taken no anticoagulant or antiplatelet agents. ASA                            Grade Assessment: II - A patient with mild systemic                            disease. After reviewing the risks and benefits,  the patient was deemed in satisfactory condition to                            undergo the procedure.                           After obtaining informed consent, the endoscope was                            passed under direct vision. Throughout the                            procedure, the patient's blood pressure, pulse, and                            oxygen saturations  were monitored continuously. The                            Olympus Scope SN [id number] was introduced through the                            mouth, and advanced to the second part of duodenum.                            The upper GI endoscopy was accomplished without                            difficulty. The patient tolerated the procedure                            well. Scope In: Scope Out: Findings:                 The examined esophagus was normal.                           A 2 cm hiatal hernia was present.                           One non-bleeding cratered gastric ulcer with no                            stigmata of bleeding was found at the pylorus. The                            lesion was 3 mm in largest dimension. Antral                            erythema. Biopsies were taken with a cold forceps                            for Helicobacter pylori testing.                           Multiple small sessile polyps with no bleeding  were                            found in the gastric fundus and in the gastric body.                           The examined duodenum was normal. Complications:            No immediate complications. Estimated Blood Loss:     Estimated blood loss: none. Impression:               - Normal esophagus.                           - 2 cm hiatal hernia.                           - Non-bleeding gastric ulcer with no stigmata of                            bleeding. Biopsied.                           - Multiple gastric polyps. 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                           - Normal examined duodenum. Recommendation:           - Patient has a contact number available for                            emergencies. The signs and symptoms of potential                            delayed complications were discussed with the                            patient. Return to normal activities tomorrow.                             Written discharge instructions were provided to the                            patient.                           - Resume previous diet.                           - Continue present medications. Continue                            pantoprazole  40 mg daily with famotidine  40 mg in  the evening x 8 weeks. Can stop famotidine  after 8                            weeks and continue pantoprazole  40 mg daily for                            GERD.                           - Await pathology results. Gordy CHRISTELLA Starch, MD 11/13/2024 3:46:33 PM This report has been signed electronically.

## 2024-11-13 NOTE — Progress Notes (Unsigned)
 See office note dated 10/29/2024 for details and current H&P  Patient presenting for upper endoscopy to evaluate epigastric pain in setting of NSAID use  She remains appropriate for upper endoscopy in [facility] today.

## 2024-11-13 NOTE — Progress Notes (Unsigned)
 To pacu, VSS. Report to [email or web address]

## 2024-11-13 NOTE — Progress Notes (Signed)
 Pt's states no medical or surgical changes since previsit or office visit.

## 2024-11-14 ENCOUNTER — Telehealth: Payer: Self-pay | Admitting: *Deleted

## 2024-11-14 NOTE — Telephone Encounter (Signed)
" °  Follow up Call-     11/13/2024    3:04 PM  Call back number  Post procedure Call Back phone  # [phone]  Permission to leave phone message Yes     Patient questions:  Do you have a fever, pain , or abdominal swelling? No. Pain Score  0 *  Have you tolerated food without any problems? Yes.    Have you been able to return to your normal activities? Yes.    Do you have any questions about your discharge instructions: Diet   No. Medications  No. Follow up visit  No.  Do you have questions or concerns about your Care? No.  Actions: * If pain score is 4 or above: No action needed, pain <4.   "

## 2025-01-10 ENCOUNTER — Ambulatory Visit: Admitting: Nurse Practitioner
